# Patient Record
Sex: Male | Born: 1965 | Race: White | Hispanic: No | Marital: Married | Smoking: Never smoker
Health system: Southern US, Community
[De-identification: ages and names within clinical notes are randomized; demographics above are authoritative.]

## PROBLEM LIST (undated history)

## (undated) DIAGNOSIS — I209 Angina pectoris, unspecified: Secondary | ICD-10-CM

## (undated) DIAGNOSIS — M25859 Other specified joint disorders, unspecified hip: Secondary | ICD-10-CM

## (undated) DIAGNOSIS — E785 Hyperlipidemia, unspecified: Secondary | ICD-10-CM

## (undated) DIAGNOSIS — M5412 Radiculopathy, cervical region: Secondary | ICD-10-CM

## (undated) DIAGNOSIS — M1990 Unspecified osteoarthritis, unspecified site: Secondary | ICD-10-CM

## (undated) DIAGNOSIS — J302 Other seasonal allergic rhinitis: Secondary | ICD-10-CM

## (undated) DIAGNOSIS — E119 Type 2 diabetes mellitus without complications: Secondary | ICD-10-CM

## (undated) HISTORY — PX: FINGER SURGERY: SHX640

## (undated) HISTORY — PX: EYE SURGERY: SHX253

---

## 2011-02-23 ENCOUNTER — Ambulatory Visit: Payer: Self-pay | Admitting: Gastroenterology

## 2015-05-22 ENCOUNTER — Encounter: Payer: Self-pay | Admitting: *Deleted

## 2015-05-23 ENCOUNTER — Ambulatory Visit
Admission: RE | Admit: 2015-05-23 | Discharge: 2015-05-23 | Disposition: A | Discharge status: Home / Self Care | Payer: 59 | Source: Ambulatory Visit | Attending: Gastroenterology | Admitting: Gastroenterology

## 2015-05-23 ENCOUNTER — Ambulatory Visit: Payer: 59 | Admitting: Anesthesiology

## 2015-05-23 ENCOUNTER — Encounter
Admission: RE | Disposition: A | Discharge status: Home / Self Care | Payer: Self-pay | Source: Ambulatory Visit | Attending: Gastroenterology

## 2015-05-23 ENCOUNTER — Encounter: Payer: Self-pay | Admitting: Anesthesiology

## 2015-05-23 DIAGNOSIS — D123 Benign neoplasm of transverse colon: Secondary | ICD-10-CM | POA: Diagnosis not present

## 2015-05-23 DIAGNOSIS — Z6837 Body mass index (BMI) 37.0-37.9, adult: Secondary | ICD-10-CM | POA: Diagnosis not present

## 2015-05-23 DIAGNOSIS — D125 Benign neoplasm of sigmoid colon: Secondary | ICD-10-CM | POA: Insufficient documentation

## 2015-05-23 DIAGNOSIS — E785 Hyperlipidemia, unspecified: Secondary | ICD-10-CM | POA: Insufficient documentation

## 2015-05-23 DIAGNOSIS — J309 Allergic rhinitis, unspecified: Secondary | ICD-10-CM | POA: Insufficient documentation

## 2015-05-23 DIAGNOSIS — E669 Obesity, unspecified: Secondary | ICD-10-CM | POA: Diagnosis not present

## 2015-05-23 DIAGNOSIS — Z7951 Long term (current) use of inhaled steroids: Secondary | ICD-10-CM | POA: Diagnosis not present

## 2015-05-23 DIAGNOSIS — Z79899 Other long term (current) drug therapy: Secondary | ICD-10-CM | POA: Diagnosis not present

## 2015-05-23 DIAGNOSIS — Z1211 Encounter for screening for malignant neoplasm of colon: Secondary | ICD-10-CM | POA: Diagnosis not present

## 2015-05-23 DIAGNOSIS — E119 Type 2 diabetes mellitus without complications: Secondary | ICD-10-CM | POA: Diagnosis not present

## 2015-05-23 HISTORY — DX: Other seasonal allergic rhinitis: J30.2

## 2015-05-23 HISTORY — PX: COLONOSCOPY WITH PROPOFOL: SHX5780

## 2015-05-23 HISTORY — DX: Hyperlipidemia, unspecified: E78.5

## 2015-05-23 HISTORY — DX: Type 2 diabetes mellitus without complications: E11.9

## 2015-05-23 LAB — GLUCOSE, CAPILLARY: GLUCOSE-CAPILLARY: 106 mg/dL — AB (ref 65–99)

## 2015-05-23 SURGERY — COLONOSCOPY WITH PROPOFOL
Anesthesia: General

## 2015-05-23 MED ORDER — SODIUM CHLORIDE 0.9 % IV SOLN
INTRAVENOUS | Status: DC
  Administered 2015-05-23: 1000 mL via INTRAVENOUS

## 2015-05-23 MED ORDER — PROPOFOL 500 MG/50ML IV EMUL
INTRAVENOUS | Status: DC | PRN
  Administered 2015-05-23: 120 ug/kg/min via INTRAVENOUS

## 2015-05-23 MED ORDER — SODIUM CHLORIDE 0.9 % IV SOLN: INTRAVENOUS | Status: DC

## 2015-05-23 MED ORDER — PROPOFOL 10 MG/ML IV BOLUS
INTRAVENOUS | Status: DC | PRN
  Administered 2015-05-23 (×3): 50 mg via INTRAVENOUS

## 2015-05-23 NOTE — Anesthesia Postprocedure Evaluation (Signed)
  Anesthesia Post-op Note  Patient: Gary Mendez  Procedure(s) Performed: Procedure(s): COLONOSCOPY WITH PROPOFOL (N/A)  Anesthesia type:General  Patient location: PACU  Post pain: Pain level controlled  Post assessment: Post-op Vital signs reviewed, Patient's Cardiovascular Status Stable, Respiratory Function Stable, Patent Airway and No signs of Nausea or vomiting  Post vital signs: Reviewed and stable  Last Vitals:  Filed Vitals:   05/23/15 1125  BP: 131/98  Pulse: 63  Temp:   Resp: 14    Level of consciousness: awake, alert  and patient cooperative  Complications: No apparent anesthesia complications

## 2015-05-23 NOTE — Op Note (Signed)
[facility] Patient Name: Gary Mendez Procedure Date: 05/23/2015 10:06 AM MRN: [id number] Account #: [id number] Date of Birth: 08/07/1966 Admit Type: Outpatient Age: 49 Room: [facility] ENDO ROOM 3 Gender: Male Note Status: Finalized Procedure:         Colonoscopy Indications:       Screening for colorectal malignant neoplasm Providers:         Lollie Sails, MD Referring MD:      Renee Rival (Referring MD) Medicines:         Monitored Anesthesia Care Complications:     No immediate complications. Procedure:         Pre-Anesthesia Assessment:                    - ASA Grade Assessment: III - A patient with severe                     systemic disease.                    After obtaining informed consent, the colonoscope was                     passed under direct vision. Throughout the procedure, the                     patient's blood pressure, pulse, and oxygen saturations                     were monitored continuously. The Colonoscope was                     introduced through the anus and advanced to the the cecum,                     identified by appendiceal orifice and ileocecal valve. The                     quality of the bowel preparation was good except the                     ascending colon was poor. Findings:      A 4 mm polyp was found at the hepatic flexure. The polyp was flat. The       polyp was removed with a cold snare. Resection and retrieval were       complete.      A 11 mm polyp was found in the distal sigmoid colon. The polyp was       pedunculated. The polyp was removed with a hot snare. Resection and       retrieval were complete.      The digital rectal exam was normal.      The retroflexed view of the distal rectum and anal verge was normal and       showed no anal or rectal abnormalities. Impression:        - One 4 mm polyp at the hepatic flexure. Resected and                     retrieved.    - One 11 mm polyp in the distal sigmoid colon. Resected                     and retrieved.                    -  The distal rectum and anal verge are normal on                     retroflexion view. Recommendation:    - Discharge patient to home.                    - Telephone GI clinic for pathology results in 1 week. Procedure Code(s): --- Professional ---                    [phone], Colonoscopy, flexible; with removal of tumor(s),                     polyp(s), or other lesion(s) by snare technique Diagnosis Code(s): --- Professional ---                    V76.51, Special screening for malignant neoplasms of colon                    211.3, Benign neoplasm of colon CPT copyright 2014 American Medical Association. All rights reserved. The codes documented in this report are preliminary and upon coder review may  be revised to meet current compliance requirements. Lollie Sails, MD 05/23/2015 10:45:01 AM This report has been signed electronically. Number of Addenda: 0 Note Initiated On: 05/23/2015 10:06 AM Scope Withdrawal Time: 0 hours 15 minutes 20 seconds  Total Procedure Duration: 0 hours 24 minutes 7 seconds       [facility]

## 2015-05-23 NOTE — Transfer of Care (Signed)
Immediate Anesthesia Transfer of Care Note  Patient: Gary Mendez  Procedure(s) Performed: Procedure(s): COLONOSCOPY WITH PROPOFOL (N/A)  Patient Location: PACU  Anesthesia Type:General  Level of Consciousness: awake and oriented  Airway & Oxygen Therapy: Patient Spontanous Breathing and Patient connected to nasal cannula oxygen  Post-op Assessment: Report given to RN and Post -op Vital signs reviewed and stable  Post vital signs: stable  Last Vitals:  Filed Vitals:   05/23/15 1040  BP:   Pulse:   Temp: 36.4 C  Resp:     Complications: No apparent anesthesia complications

## 2015-05-23 NOTE — H&P (Signed)
Outpatient short stay form Pre-procedure 05/23/2015 10:04 AM Lollie Sails MD  Primary Physician: Angelina Ok FNP  Reason for visit:  Screening colonoscopy  History of present illness:  Patient is a 49 year old male presenting for his first colonoscopy. He takes no aspirin products or blood thinners. He tolerated his prep for colonoscopy well.    Current facility-administered medications:  .  0.9 %  sodium chloride infusion, , Intravenous, Continuous, Lollie Sails, MD, Last Rate: 20 mL/hr at 05/23/15 0944, 1,000 mL at 05/23/15 0944 .  0.9 %  sodium chloride infusion, , Intravenous, Continuous, Lollie Sails, MD  Prescriptions prior to admission  Medication Sig Dispense Refill Last Dose  . fexofenadine (ALLEGRA) 180 MG tablet Take 180 mg by mouth daily.   Past Week at Unknown time  . fluticasone (FLONASE) 50 MCG/ACT nasal spray Place into both nostrils daily.   Past Week at Unknown time  . metFORMIN (GLUCOPHAGE) 500 MG tablet Take 500 mg by mouth every evening.   Past Week at Unknown time  . pravastatin (PRAVACHOL) 80 MG tablet Take 80 mg by mouth daily.   Past Week at Unknown time  . sildenafil (REVATIO) 20 MG tablet Take 20-40 mg by mouth daily as needed.   Past Week at Unknown time     Allergies  Allergen Reactions  . Propoxyphene Nausea Only    Darvocet-N 100 (Propoxyphene N-Acetaminophen) Nausea and Dizziness      Past Medical History  Diagnosis Date  . Hyperlipidemia   . Diabetes mellitus without complication   . Seasonal allergic rhinitis     Review of systems:      Physical Exam    Heart and lungs: Regular rate and rhythm without rub or gallop, lungs are bilaterally clear    HEENT: Norm cephalic atraumatic eyes are anicteric    Other:     Pertinant exam for procedure: Protuberant nontender bowel sounds are positive normoactive    Planned proceedures: Colonoscopy and indicated procedures I have discussed the risks benefits and  complications of procedures to include not limited to bleeding, infection, perforation and the risk of sedation and the patient wishes to proceed.    Lollie Sails, MD Gastroenterology 05/23/2015  10:04 AM

## 2015-05-23 NOTE — Anesthesia Preprocedure Evaluation (Signed)
Anesthesia Evaluation  Patient identified by MRN, date of birth, ID band Patient awake    Reviewed: Allergy & Precautions, H&P , NPO status , Patient's Chart, lab work & pertinent test results  Airway Mallampati: II  TM Distance: >3 FB Neck ROM: full    Dental no notable dental hx. (+) Teeth Intact   Pulmonary neg shortness of breath,    Pulmonary exam normal breath sounds clear to auscultation       Cardiovascular Exercise Tolerance: Good (-) Past MI Normal cardiovascular exam Rhythm:regular Rate:Normal     Neuro/Psych negative neurological ROS  negative psych ROS   GI/Hepatic Neg liver ROS, GERD  Controlled,  Endo/Other  diabetes, Type 2  Renal/GU negative Renal ROS  negative genitourinary   Musculoskeletal   Abdominal   Peds  Hematology negative hematology ROS (+)   Anesthesia Other Findings Past Medical History:   Hyperlipidemia                                               Diabetes mellitus without complication                       Seasonal allergic rhinitis                                  Obesity BMI 37 Signs and symptoms suggestive of sleep apnea     Reproductive/Obstetrics negative OB ROS                             Anesthesia Physical Anesthesia Plan  ASA: III  Anesthesia Plan: General   Post-op Pain Management:    Induction:   Airway Management Planned:   Additional Equipment:   Intra-op Plan:   Post-operative Plan:   Informed Consent: I have reviewed the patients History and Physical, chart, labs and discussed the procedure including the risks, benefits and alternatives for the proposed anesthesia with the patient or authorized representative who has indicated his/her understanding and acceptance.   Dental Advisory Given  Plan Discussed with: Anesthesiologist, CRNA and Surgeon  Anesthesia Plan Comments:         Anesthesia Quick Evaluation

## 2015-05-26 LAB — SURGICAL PATHOLOGY

## 2015-06-13 ENCOUNTER — Encounter: Payer: Self-pay | Admitting: Gastroenterology

## 2018-05-25 ENCOUNTER — Encounter: Payer: Self-pay | Admitting: *Deleted

## 2018-05-26 ENCOUNTER — Ambulatory Visit: Payer: 59 | Admitting: Anesthesiology

## 2018-05-26 ENCOUNTER — Encounter: Payer: Self-pay | Admitting: Anesthesiology

## 2018-05-26 ENCOUNTER — Ambulatory Visit
Admission: RE | Admit: 2018-05-26 | Discharge: 2018-05-26 | Disposition: A | Discharge status: Home / Self Care | Payer: 59 | Source: Ambulatory Visit | Attending: Gastroenterology | Admitting: Gastroenterology

## 2018-05-26 ENCOUNTER — Encounter
Admission: RE | Disposition: A | Discharge status: Home / Self Care | Payer: Self-pay | Source: Ambulatory Visit | Attending: Gastroenterology

## 2018-05-26 DIAGNOSIS — J302 Other seasonal allergic rhinitis: Secondary | ICD-10-CM | POA: Insufficient documentation

## 2018-05-26 DIAGNOSIS — Z6837 Body mass index (BMI) 37.0-37.9, adult: Secondary | ICD-10-CM | POA: Insufficient documentation

## 2018-05-26 DIAGNOSIS — E119 Type 2 diabetes mellitus without complications: Secondary | ICD-10-CM | POA: Insufficient documentation

## 2018-05-26 DIAGNOSIS — Z7951 Long term (current) use of inhaled steroids: Secondary | ICD-10-CM | POA: Diagnosis not present

## 2018-05-26 DIAGNOSIS — E785 Hyperlipidemia, unspecified: Secondary | ICD-10-CM | POA: Diagnosis not present

## 2018-05-26 DIAGNOSIS — E669 Obesity, unspecified: Secondary | ICD-10-CM | POA: Diagnosis not present

## 2018-05-26 DIAGNOSIS — Z79899 Other long term (current) drug therapy: Secondary | ICD-10-CM | POA: Diagnosis not present

## 2018-05-26 DIAGNOSIS — Z7984 Long term (current) use of oral hypoglycemic drugs: Secondary | ICD-10-CM | POA: Diagnosis not present

## 2018-05-26 DIAGNOSIS — Z86010 Personal history of colonic polyps: Secondary | ICD-10-CM | POA: Insufficient documentation

## 2018-05-26 DIAGNOSIS — D124 Benign neoplasm of descending colon: Secondary | ICD-10-CM | POA: Diagnosis not present

## 2018-05-26 DIAGNOSIS — Z885 Allergy status to narcotic agent status: Secondary | ICD-10-CM | POA: Insufficient documentation

## 2018-05-26 DIAGNOSIS — K5730 Diverticulosis of large intestine without perforation or abscess without bleeding: Secondary | ICD-10-CM | POA: Diagnosis not present

## 2018-05-26 DIAGNOSIS — Z09 Encounter for follow-up examination after completed treatment for conditions other than malignant neoplasm: Secondary | ICD-10-CM | POA: Insufficient documentation

## 2018-05-26 HISTORY — PX: COLONOSCOPY WITH PROPOFOL: SHX5780

## 2018-05-26 LAB — GLUCOSE, CAPILLARY: GLUCOSE-CAPILLARY: 146 mg/dL — AB (ref 70–99)

## 2018-05-26 SURGERY — COLONOSCOPY WITH PROPOFOL
Anesthesia: General

## 2018-05-26 MED ORDER — PROPOFOL 500 MG/50ML IV EMUL
INTRAVENOUS | Status: AC
  Filled 2018-05-26: qty 50

## 2018-05-26 MED ORDER — LIDOCAINE HCL (PF) 2 % IJ SOLN
INTRAMUSCULAR | Status: AC
  Filled 2018-05-26: qty 10

## 2018-05-26 MED ORDER — PROPOFOL 500 MG/50ML IV EMUL
INTRAVENOUS | Status: DC | PRN
  Administered 2018-05-26: 200 ug/kg/min via INTRAVENOUS

## 2018-05-26 MED ORDER — FENTANYL CITRATE (PF) 100 MCG/2ML IJ SOLN
INTRAMUSCULAR | Status: AC
  Filled 2018-05-26: qty 2

## 2018-05-26 MED ORDER — SODIUM CHLORIDE 0.9 % IV SOLN
INTRAVENOUS | Status: DC | PRN
  Administered 2018-05-26: 07:00:00 via INTRAVENOUS

## 2018-05-26 MED ORDER — EPHEDRINE SULFATE 50 MG/ML IJ SOLN
INTRAMUSCULAR | Status: AC
  Filled 2018-05-26: qty 1

## 2018-05-26 MED ORDER — LIDOCAINE HCL (PF) 1 % IJ SOLN
INTRAMUSCULAR | Status: AC
  Filled 2018-05-26: qty 2

## 2018-05-26 MED ORDER — PROPOFOL 10 MG/ML IV BOLUS
INTRAVENOUS | Status: DC | PRN
  Administered 2018-05-26: 50 mg via INTRAVENOUS
  Administered 2018-05-26: 100 mg via INTRAVENOUS

## 2018-05-26 MED ORDER — PROPOFOL 10 MG/ML IV BOLUS
INTRAVENOUS | Status: AC
  Filled 2018-05-26: qty 20

## 2018-05-26 MED ORDER — PHENYLEPHRINE HCL 10 MG/ML IJ SOLN
INTRAMUSCULAR | Status: AC
  Filled 2018-05-26: qty 1

## 2018-05-26 MED ORDER — PHENYLEPHRINE HCL 10 MG/ML IJ SOLN
INTRAMUSCULAR | Status: DC | PRN
  Administered 2018-05-26: 100 ug via INTRAVENOUS

## 2018-05-26 MED ORDER — FENTANYL CITRATE (PF) 100 MCG/2ML IJ SOLN
INTRAMUSCULAR | Status: DC | PRN
  Administered 2018-05-26 (×2): 50 ug via INTRAVENOUS

## 2018-05-26 MED ORDER — LIDOCAINE 2% (20 MG/ML) 5 ML SYRINGE
INTRAMUSCULAR | Status: DC | PRN
  Administered 2018-05-26: 40 mg via INTRAVENOUS

## 2018-05-26 NOTE — Op Note (Signed)
[facility] Gastroenterology Patient Name: Gary Mendez Procedure Date: 05/26/2018 7:24 AM MRN: [id number] Account #: [id number] Date of Birth: [birth year] Admit Type: Outpatient Age: 53 Room: [facility] ENDO ROOM 3 Gender: Male Note Status: Finalized Procedure:            Colonoscopy Indications:          Personal history of colonic polyps Providers:            Lollie Sails, MD Referring MD:         Renee Rival (Referring MD) Medicines:            Monitored Anesthesia Care Complications:        No immediate complications. Procedure:            Pre-Anesthesia Assessment:                       - ASA Grade Assessment: III - A patient with severe                        systemic disease.                       After obtaining informed consent, the colonoscope was                        passed under direct vision. Throughout the procedure,                        the patient's blood pressure, pulse, and oxygen                        saturations were monitored continuously. The                        Colonoscope was introduced through the anus and                        advanced to the the cecum, identified by appendiceal                        orifice and ileocecal valve. The colonoscopy was                        performed without difficulty. The patient tolerated the                        procedure well. The quality of the bowel preparation                        was good. Findings:      Multiple small-mouthed diverticula were found in the sigmoid colon and       descending colon.      Two sessile polyps were found in the descending colon. The polyps were 3       to 5 mm in size. These polyps were removed with a cold snare. Resection       and retrieval were complete.      A 1 mm polyp was found in the rectum. The polyp was sessile. The polyp       was removed with a cold biopsy forceps. Resection and retrieval were  complete.      The retroflexed view  of the distal rectum and anal verge was normal and       showed no anal or rectal abnormalities.      The digital rectal exam was normal. Impression:           - Diverticulosis in the sigmoid colon and in the                        descending colon.                       - Two 3 to 5 mm polyps in the descending colon, removed                        with a cold snare. Resected and retrieved.                       - One 1 mm polyp in the rectum, removed with a cold                        biopsy forceps. Resected and retrieved.                       - The distal rectum and anal verge are normal on                        retroflexion view. Recommendation:       - Discharge patient to home.                       - Telephone GI clinic for pathology results in 1 week. Procedure Code(s):    --- Professional ---                       [phone], Colonoscopy, flexible; with removal of tumor(s),                        polyp(s), or other lesion(s) by snare technique                       45380, 2, Colonoscopy, flexible; with biopsy, single                        or multiple Diagnosis Code(s):    --- Professional ---                       D12.4, Benign neoplasm of descending colon                       K62.1, Rectal polyp                       Z86.010, Personal history of colonic polyps                       K57.30, Diverticulosis of large intestine without                        perforation or abscess without bleeding CPT copyright 2017 American Medical Association. All rights reserved. The codes documented in this report are preliminary and upon  coder review may  be revised to meet current compliance requirements. Lollie Sails, MD 05/26/2018 8:09:24 AM This report has been signed electronically. Number of Addenda: 0 Note Initiated On: 05/26/2018 7:24 AM Scope Withdrawal Time: 0 hours 13 minutes 16 seconds  Total Procedure Duration: 0 hours 18 minutes 37 seconds       [facility]

## 2018-05-26 NOTE — Transfer of Care (Signed)
Immediate Anesthesia Transfer of Care Note  Patient: Gary Mendez  Procedure(s) Performed: COLONOSCOPY WITH PROPOFOL (N/A )  Patient Location: PACU and Endoscopy Unit  Anesthesia Type:General  Level of Consciousness: awake and drowsy  Airway & Oxygen Therapy: Patient Spontanous Breathing and Patient connected to nasal cannula oxygen  Post-op Assessment: Report given to RN and Post -op Vital signs reviewed and stable  Post vital signs: Reviewed and stable  Last Vitals:  Vitals Value Taken Time  BP    Temp    Pulse    Resp    SpO2      Last Pain:  Vitals:   05/26/18 0711  TempSrc: Tympanic  PainSc: 0-No pain         Complications: No apparent anesthesia complications

## 2018-05-26 NOTE — Anesthesia Procedure Notes (Signed)
Performed by: Mumtaz Lovins, CRNA       

## 2018-05-26 NOTE — Anesthesia Post-op Follow-up Note (Signed)
Anesthesia QCDR form completed.        

## 2018-05-26 NOTE — H&P (Signed)
Outpatient short stay form Pre-procedure 05/26/2018 7:16 AM Lollie Sails MD  Primary Physician: Angelina Ok, NP  Reason for visit: Colonoscopy  History of present illness: Patient is a 52 year old male presenting today as above.  He has a personal history of adenomatous colon polyps with his last colonoscopy 05/23/2015.  Tolerated his prep well.  He takes no aspirin or blood thinning agent.   No current facility-administered medications for this encounter.   Facility-Administered Medications Ordered in Other Encounters:  .  0.9 %  sodium chloride infusion, , , Continuous PRN, Courtney Paris, CRNA  Medications Prior to Admission  Medication Sig Dispense Refill Last Dose  . Multiple Vitamin (MULTIVITAMIN) tablet Take 1 tablet by mouth daily.     . fexofenadine (ALLEGRA) 180 MG tablet Take 180 mg by mouth daily.   Past Week at Unknown time  . fluticasone (FLONASE) 50 MCG/ACT nasal spray Place into both nostrils daily.   Past Week at Unknown time  . metFORMIN (GLUCOPHAGE) 500 MG tablet Take 500 mg by mouth every evening.   Past Week at Unknown time  . pravastatin (PRAVACHOL) 80 MG tablet Take 80 mg by mouth daily.   Past Week at Unknown time  . sildenafil (REVATIO) 20 MG tablet Take 20-40 mg by mouth daily as needed.   Past Week at Unknown time     Allergies  Allergen Reactions  . Propoxyphene Nausea Only    Darvocet-N 100 (Propoxyphene N-Acetaminophen) Nausea and Dizziness      Past Medical History:  Diagnosis Date  . Diabetes mellitus without complication ([place])   . Hyperlipidemia   . Seasonal allergic rhinitis     Review of systems:      Physical Exam    Heart and lungs: Regular rate and rhythm without rub or gallop, lungs are bilaterally clear.    HEENT: Normocephalic atraumatic eyes are anicteric    Other:    Pertinant exam for procedure: Soft nontender nondistended bowel sounds positive normoactive.    Planned proceedures: Colonoscopy and indicated  procedures. I have discussed the risks benefits and complications of procedures to include not limited to bleeding, infection, perforation and the risk of sedation and the patient wishes to proceed.    Lollie Sails, MD Gastroenterology 05/26/2018  7:16 AM

## 2018-05-26 NOTE — Anesthesia Preprocedure Evaluation (Signed)
Anesthesia Evaluation  Patient identified by MRN, date of birth, ID band Patient awake    Reviewed: Allergy & Precautions, H&P , NPO status , Patient's Chart, lab work & pertinent test results  Airway Mallampati: II  TM Distance: >3 FB Neck ROM: full    Dental no notable dental hx. (+) Teeth Intact   Pulmonary neg shortness of breath,           Cardiovascular Exercise Tolerance: Good (-) Past MI      Neuro/Psych negative neurological ROS  negative psych ROS   GI/Hepatic Neg liver ROS, GERD  Controlled,  Endo/Other  diabetes, Type 2  Renal/GU negative Renal ROS  negative genitourinary   Musculoskeletal   Abdominal   Peds  Hematology negative hematology ROS (+)   Anesthesia Other Findings Past Medical History:   Hyperlipidemia                                               Diabetes mellitus without complication                       Seasonal allergic rhinitis                                  Obesity BMI 37 Signs and symptoms suggestive of sleep apnea     Reproductive/Obstetrics negative OB ROS                             Anesthesia Physical  Anesthesia Plan  ASA: III  Anesthesia Plan: General   Post-op Pain Management:    Induction: Intravenous  PONV Risk Score and Plan: 2 and Propofol infusion and TIVA  Airway Management Planned: Natural Airway and Nasal Cannula  Additional Equipment:   Intra-op Plan:   Post-operative Plan:   Informed Consent: I have reviewed the patients History and Physical, chart, labs and discussed the procedure including the risks, benefits and alternatives for the proposed anesthesia with the patient or authorized representative who has indicated his/her understanding and acceptance.   Dental Advisory Given  Plan Discussed with: Anesthesiologist, CRNA and Surgeon  Anesthesia Plan Comments:         Anesthesia Quick Evaluation

## 2018-05-27 ENCOUNTER — Encounter: Payer: Self-pay | Admitting: Gastroenterology

## 2018-05-28 NOTE — Anesthesia Postprocedure Evaluation (Signed)
Anesthesia Post Note  Patient: Gary Mendez  Procedure(s) Performed: COLONOSCOPY WITH PROPOFOL (N/A )  Patient location during evaluation: Endoscopy Anesthesia Type: General Level of consciousness: awake and alert Pain management: pain level controlled Vital Signs Assessment: post-procedure vital signs reviewed and stable Respiratory status: spontaneous breathing, nonlabored ventilation, respiratory function stable and patient connected to nasal cannula oxygen Cardiovascular status: blood pressure returned to baseline and stable Postop Assessment: no apparent nausea or vomiting Anesthetic complications: no     Last Vitals:  Vitals:   05/26/18 0829 05/26/18 0839  BP: 115/86 115/81  Pulse: 72 69  Resp: 11 12  Temp:    SpO2: 99% 99%    Last Pain:  Vitals:   05/27/18 0948  TempSrc:   PainSc: 0-No pain                 Martha Clan

## 2018-05-30 LAB — SURGICAL PATHOLOGY

## 2018-11-14 ENCOUNTER — Other Ambulatory Visit: Payer: Self-pay | Admitting: Nurse Practitioner

## 2018-11-14 DIAGNOSIS — M5412 Radiculopathy, cervical region: Secondary | ICD-10-CM

## 2018-12-15 ENCOUNTER — Ambulatory Visit: Payer: 59

## 2019-01-08 ENCOUNTER — Ambulatory Visit: Payer: 59

## 2019-02-08 ENCOUNTER — Other Ambulatory Visit: Payer: Self-pay

## 2019-02-08 ENCOUNTER — Ambulatory Visit
Admission: RE | Admit: 2019-02-08 | Discharge: 2019-02-08 | Disposition: A | Discharge status: Home / Self Care | Payer: 59 | Source: Ambulatory Visit | Attending: Nurse Practitioner | Admitting: Nurse Practitioner

## 2019-02-08 DIAGNOSIS — M5412 Radiculopathy, cervical region: Secondary | ICD-10-CM | POA: Diagnosis present

## 2019-02-08 IMAGING — MR MRI CERVICAL SPINE WITHOUT CONTRAST
5 series · 38 of 48 positions shown · non-contrast
Comparison: None.

CLINICAL DATA: Neck pain, pain between the shoulders and left arm
pain and tingling for 3 months. No known injury.

EXAM:
MRI CERVICAL SPINE WITHOUT CONTRAST
TECHNIQUE: Multiplanar, multisequence MR imaging of the cervical spine was
performed. No intravenous contrast was administered.

[Series 5: T2 · sagittal · 3.0mm · 0.62mm/px · 6 of 15 slices shown (1 of 2)]
[im 1/15]
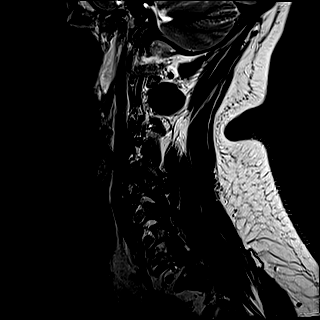
[im 3/15]
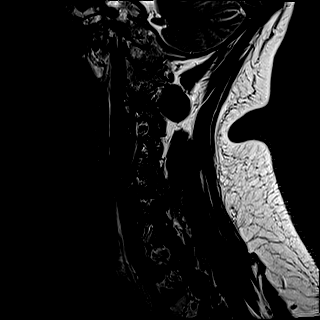
[im 6/15]
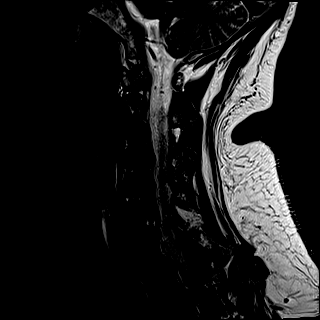
[im 9/15]
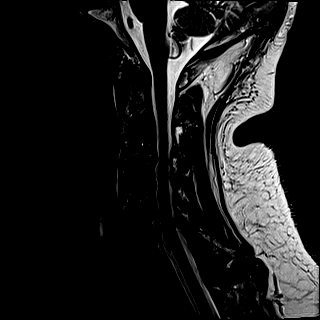
[im 12/15]
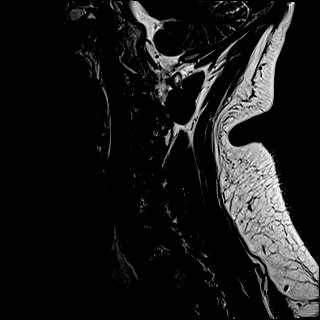
[im 15/15]
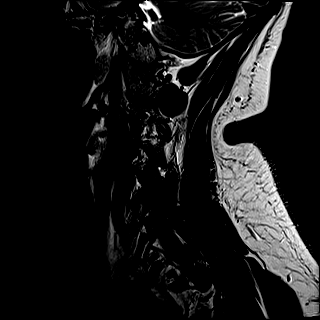

[Series 6: FLAIR · sagittal · 3.0mm · 0.78mm/px · 7 of 15 slices shown]
[im 1/15]
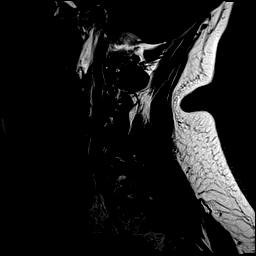
[im 3/15]
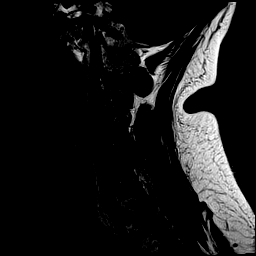
[im 5/15]
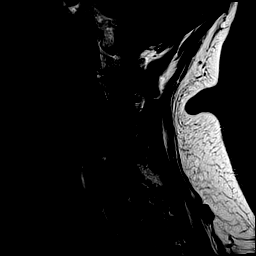
[im 8/15]
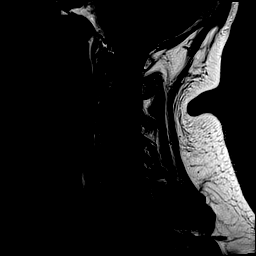
[im 10/15]
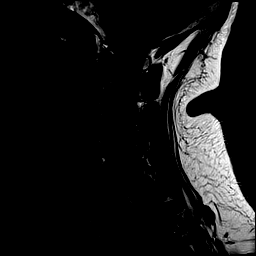
[im 12/15]
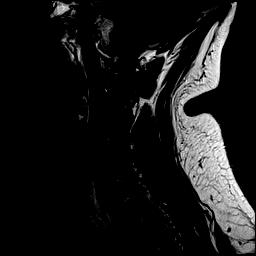
[im 15/15]
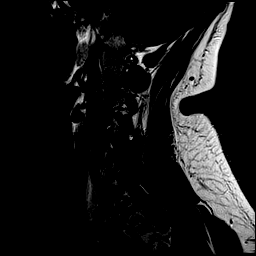

[Series 7: STIR · sagittal · 3.0mm · 0.62mm/px · 7 of 15 slices shown]
[im 1/15]
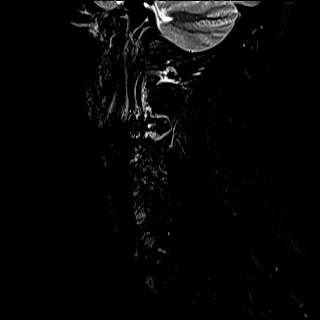
[im 3/15]
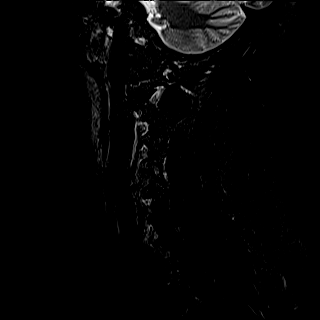
[im 5/15]
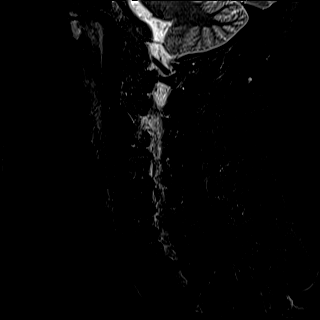
[im 8/15]
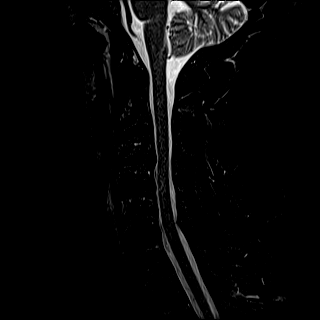
[im 10/15]
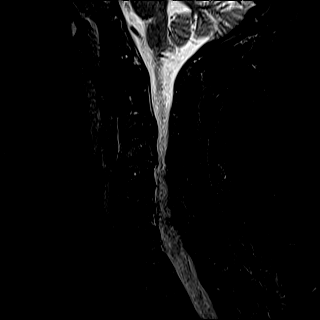
[im 12/15]
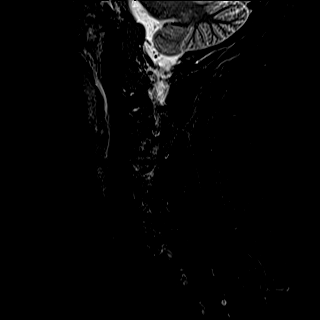
[im 15/15]
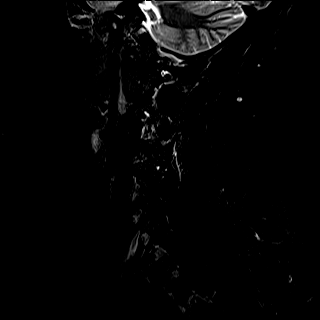

[Series 8: T2 · axial · 3.0mm · 0.70mm/px · z∈[-73,+23]mm · 10 of 29 slices shown (2 of 2)]
[im 1/29]
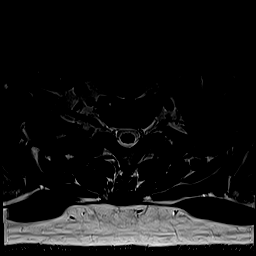
[im 3/29]
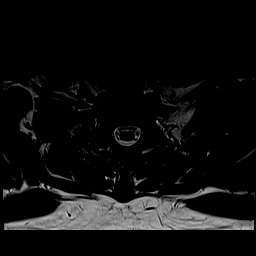
[im 5/29]
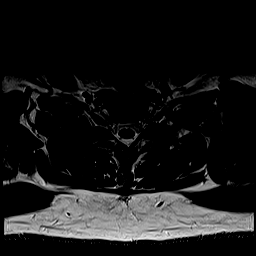
[im 7/29]
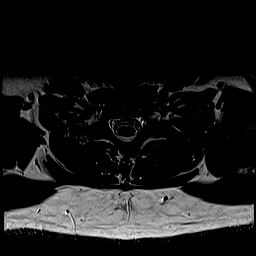
[im 9/29]
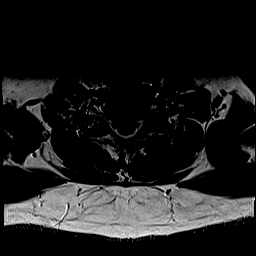
[im 13/29]
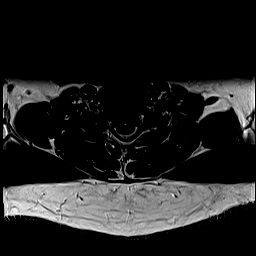
[im 16/29]
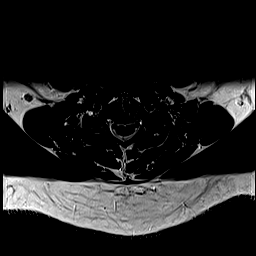
[im 20/29]
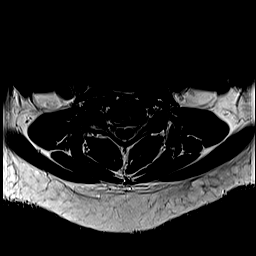
[im 24/29]
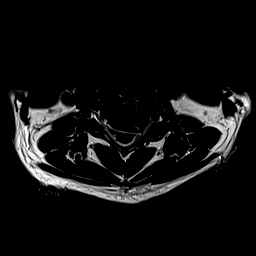
[im 29/29]
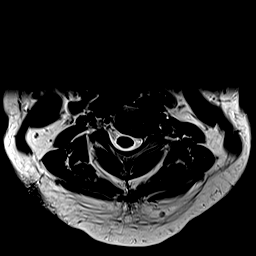

[Series 9: ax mpgr · axial · 3.0mm · 0.35mm/px · z∈[-73,+23]mm · 8 of 29 slices shown]
[im 1/29]
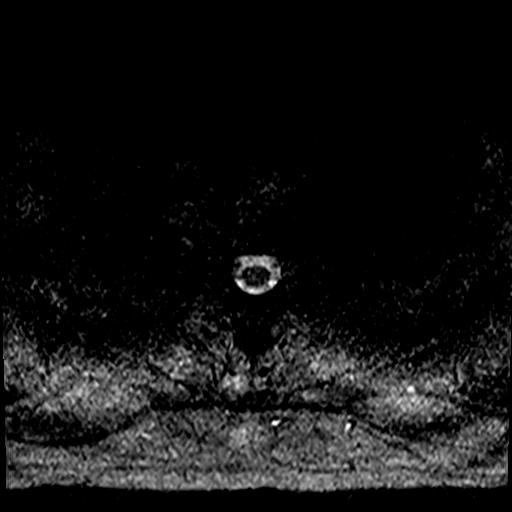
[im 5/29]
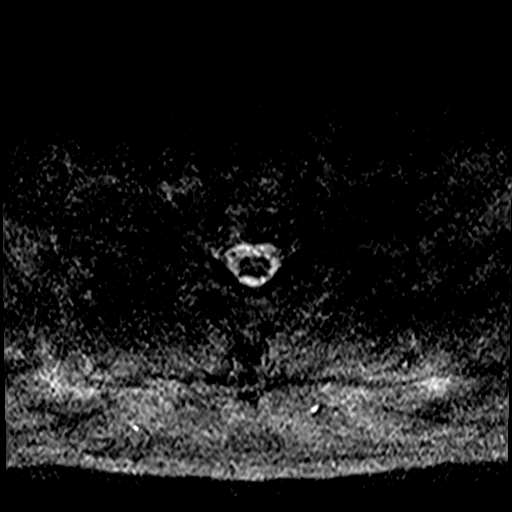
[im 9/29]
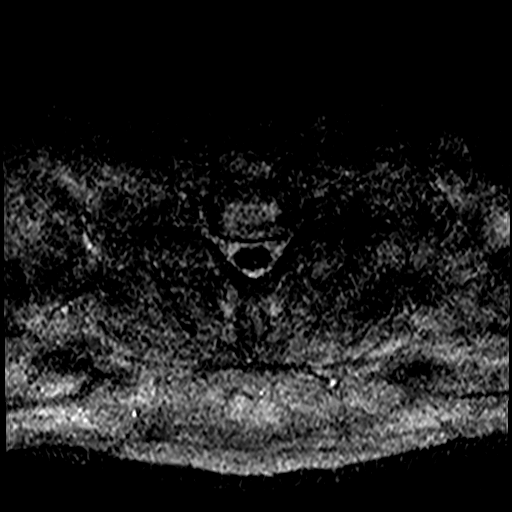
[im 13/29]
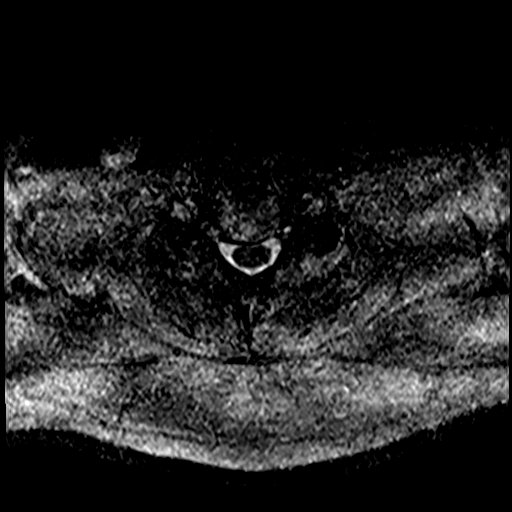
[im 16/29]
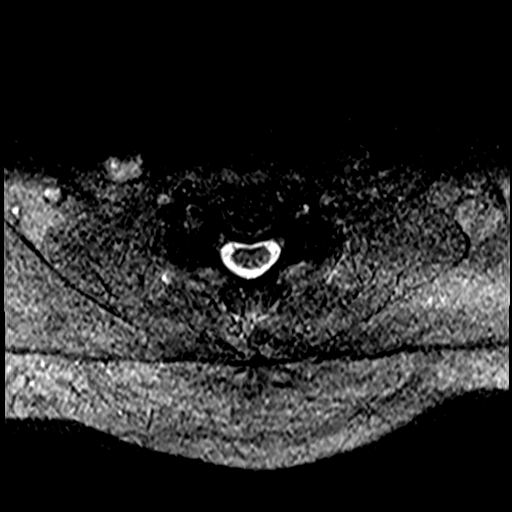
[im 20/29]
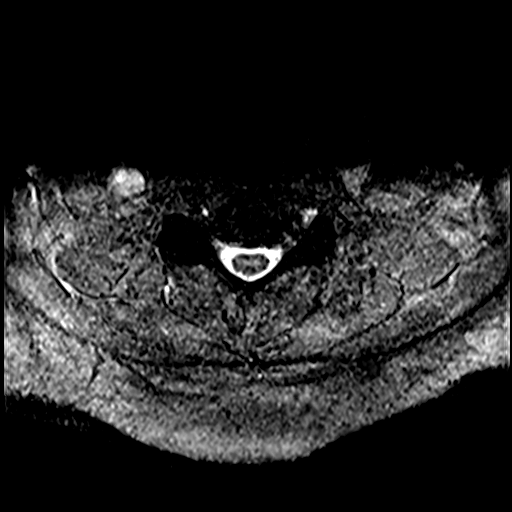
[im 24/29]
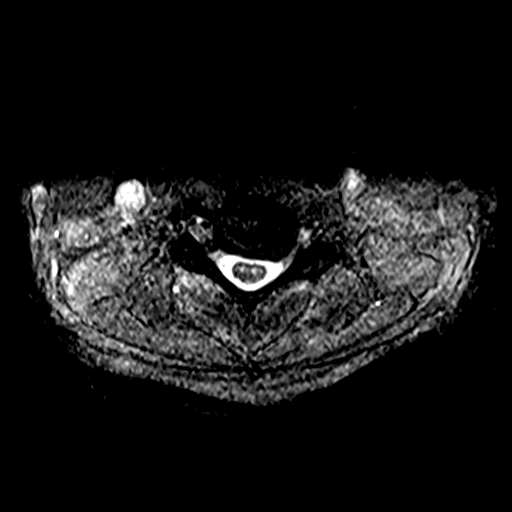
[im 29/29]
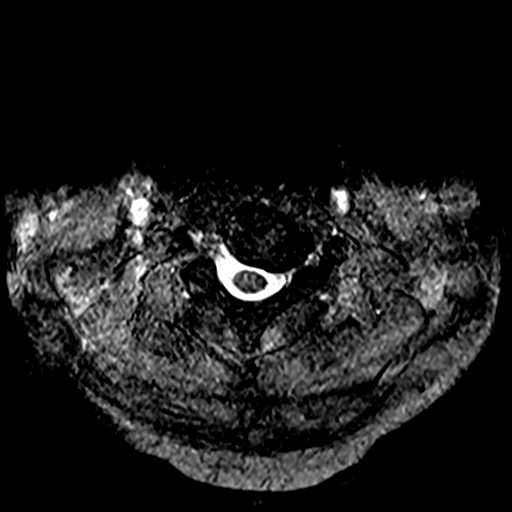

[38 of 48 positions shown; findings below may reference images not displayed]

FINDINGS: Alignment: Maintained with straightening of lordosis noted.

Vertebrae: No fracture or worrisome lesion.

Cord: Normal signal throughout.

Posterior Fossa, vertebral arteries, paraspinal tissues: Negative.

Disc levels:

C2-3: Uncovertebral spurring on the left causes moderately severe
foraminal stenosis. The central canal and right foramen are open.

C3-4: There is a shallow disc bulge and uncovertebral disease, more
notable on the left. Mild central canal narrowing is present. The
right foramen is open. Moderate to moderately severe left foraminal
narrowing is identified.

C4-5: Right much worse than left facet degenerative change is seen.
There is mild marrow edema in the right facets. The patient has a
shallow disc bulge and bilateral uncovertebral disease. The ventral
thecal sac is narrowed but not effaced. Moderate to moderately
severe foraminal narrowing is worse on the right.

C5-6: Shallow disc bulge and uncovertebral disease. The central
canal is open. Mild bilateral foraminal narrowing.

C6-7: There is a shallow disc bulge, uncovertebral disease and some
facet arthropathy, more notable on the right. The central canal is
open. Moderately severe bilateral foraminal narrowing is present.

C7-T1: Mild facet degenerative change.  Otherwise negative.
IMPRESSION: Moderately severe left foraminal narrowing at C2-3 due to
uncovertebral spurring.

Moderate to moderately severe left foraminal narrowing at C3-4 due
to uncovertebral spurring. There is mild central canal stenosis at
this level.

Mild marrow edema in the right facets at C4-5. Disc narrows but does
not efface the ventral thecal sac at this level. Moderate to
moderately severe foraminal narrowing is worse on the right.

Mild bilateral foraminal narrowing C5-6.

Uncovertebral disease causes moderately severe bilateral foraminal
narrowing at C6-7.

## 2019-02-09 ENCOUNTER — Ambulatory Visit: Payer: 59

## 2020-09-23 DIAGNOSIS — Z9889 Other specified postprocedural states: Secondary | ICD-10-CM

## 2020-09-23 HISTORY — DX: Other specified postprocedural states: Z98.890

## 2020-10-06 ENCOUNTER — Other Ambulatory Visit: Payer: Self-pay | Admitting: Nurse Practitioner

## 2020-10-06 DIAGNOSIS — M25512 Pain in left shoulder: Secondary | ICD-10-CM

## 2020-10-07 ENCOUNTER — Other Ambulatory Visit: Payer: Self-pay

## 2020-10-07 ENCOUNTER — Ambulatory Visit (HOSPITAL_COMMUNITY)
Admission: RE | Admit: 2020-10-07 | Discharge: 2020-10-07 | Disposition: A | Discharge status: Home / Self Care | Payer: 59 | Source: Ambulatory Visit | Attending: Nurse Practitioner | Admitting: Nurse Practitioner

## 2020-10-07 DIAGNOSIS — M25512 Pain in left shoulder: Secondary | ICD-10-CM | POA: Diagnosis not present
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
## 2020-10-21 ENCOUNTER — Other Ambulatory Visit: Payer: Self-pay | Admitting: Surgery

## 2020-10-23 ENCOUNTER — Other Ambulatory Visit: Payer: Self-pay

## 2020-10-23 ENCOUNTER — Other Ambulatory Visit
Admission: RE | Admit: 2020-10-23 | Discharge: 2020-10-23 | Disposition: A | Discharge status: Home / Self Care | Payer: 59 | Source: Ambulatory Visit | Attending: Surgery | Admitting: Surgery

## 2020-10-23 HISTORY — DX: Unspecified osteoarthritis, unspecified site: M19.90

## 2020-10-23 HISTORY — DX: Radiculopathy, cervical region: M54.12

## 2020-10-23 HISTORY — DX: Other specified joint disorders, unspecified hip: M25.859

## 2020-10-23 NOTE — Patient Instructions (Addendum)
INSTRUCTIONS FOR SURGERY     Your surgery is scheduled for:   Wednesday, MARCH 9TH    To find out your arrival time for the day of surgery,          please call [phone] between 1 pm and 3 pm on :  Tuesday, MARCH 8TH     When you arrive for surgery, report to [facility].     ONCE THEY ARE DONE WITH YOU, GO TO THE SECOND FLOOR AND SIGN IN AT THE       SURGICAL DESK.        REMEMBER: Instructions that are not followed completely may result in serious medical risk,  up to and including death, or upon the discretion of your surgeon and anesthesiologist,            your surgery may need to be rescheduled.  __X__ 1. Do not eat food after midnight the night before your procedure.                    No gum, candy, lozenger, tic tacs, tums or hard candies.                  ABSOLUTELY NOTHING SOLID IN YOUR MOUTH AFTER MIDNIGHT                    You may drink unlimited clear liquids up to 2 hours before you are scheduled to arrive for surgery.                   Do not drink anything within those 2 hours unless you need to take medicine, then take the                   smallest amount you need.  Clear liquids include:  water, apple juice without pulp,                   any flavor Gatorade, Black coffee, black tea.  Sugar may be added but no dairy/ honey /lemon.                        Broth and jello is not considered a clear liquid.  __x__  2. On the morning of surgery, please brush your teeth with toothpaste and water. You may rinse with                  mouthwash if you wish but DO NOT SWALLOW TOOTHPASTE OR MOUTHWASH  __X___3. NO alcohol for 24 hours before or after surgery.  __x___ 4.  Do NOT smoke or use e-cigarettes for 24 HOURS PRIOR TO SURGERY.                      DO NOT Use any chewable tobacco products for at least 6 hours prior to surgery.  __x___ 5. If you start any new medication after this  appointment and prior to surgery, please                   Bring it with you on  the day of surgery.  ___x__ 6. Notify your doctor if there is any change in your medical condition, such as fever,                  infection, vomitting, diarrhea or any open sores.  __x___ 7.  USE the CHG SOAP as instructed, the night before surgery and the day of surgery.                   Once you have washed with this soap, do NOT use any of the following: Powders, perfumes                    or lotions. Please do not wear make up, hairpins, clips or nail polish. You MAY NOT wear deodorant.                   Men may shave their face and neck.                     DO NOT wear ANY jewelry on the day of surgery. If there are rings that are too tight to                    remove easily, please address this prior to the surgery day. Piercings need to be removed.                                                                     NO METAL ON YOUR BODY.                    Do NOT bring any valuables.  If you came to Pre-Admit testing then you will not need license,                     insurance card or credit card.  If you will be staying overnight, please either leave your things in                     the car or have your family be responsible for these items.                     [facility] IS NOT RESPONSIBLE FOR BELONGINGS OR VALUABLES.  ___X__ 8. DO NOT wear contact lenses on surgery day.  You may not have dentures,                     Hearing aides, contacts or glasses in the operating room. These items can be                    Placed in the Recovery Room to receive immediately after surgery.  __x___ 9. IF YOU ARE SCHEDULED TO GO HOME ON THE SAME DAY, YOU MUST                   Have someone to drive you home and to stay with you  for the first 24 hours.                    Have an arrangement prior to arriving on surgery day.  ___x__ 10. Take the following medications on the morning of surgery with a sip of  water:                              1. ALLEGRA, if you need it                     2. FLONASE, if you need it                     3.   __X__  12. STOP ALL ASPIRIN PRODUCTS AS OF TODAY, MARCH 3RD                       THIS INCLUDES BC POWDERS / GOODIES POWDER  __x___ 13. STOP Anti-inflammatories as of TODAY, MARCH 3RD                      This includes IBUPROFEN / MOTRIN / ADVIL / ALEVE/ NAPROXYN                    YOU MAY TAKE TYLENOL ANY TIME PRIOR TO SURGERY.  __X___ 14.  Stop supplements until after surgery.                     This includes: MULTIVITAMINS / [place] may continue taking Vitamin B12 / Vitamin D3 but do not take on the morning of surgery.  _X_____16.  Stop Metformin 2 full days prior to surgery.  Stop AFTER DOSE ON Sunday, MARCH 6TH.                        TAKE YOU OZEMPIC AS USUAL THIS Sunday.                                     Do NOT take any diabetes medications on surgery day.  __X___17.  Continue to take the following medications but do not take on the morning of surgery:                       METFORMIN   __X____18.  Wear clean and comfortable clothing to the hospital.                     AN OVERSIZED SHIRT OR A BUTTON DOWN WOULD WORK BEST.  CONTINUE TO TAKE YOUR EVENING MEDICATIONS AS USUAL.   BRING YOUR SMILE TO SURGERY !!  :):)

## 2020-10-27 ENCOUNTER — Encounter
Admission: RE | Admit: 2020-10-27 | Discharge: 2020-10-27 | Disposition: A | Discharge status: Home / Self Care | Payer: 59 | Source: Ambulatory Visit | Attending: Surgery | Admitting: Surgery

## 2020-10-27 ENCOUNTER — Other Ambulatory Visit: Payer: Self-pay

## 2020-10-27 DIAGNOSIS — Z20822 Contact with and (suspected) exposure to covid-19: Secondary | ICD-10-CM | POA: Insufficient documentation

## 2020-10-27 DIAGNOSIS — Z01812 Encounter for preprocedural laboratory examination: Secondary | ICD-10-CM | POA: Insufficient documentation

## 2020-10-27 LAB — BASIC METABOLIC PANEL
Anion gap: 9 (ref 5–15)
BUN: 17 mg/dL (ref 6–20)
CO2: 22 mmol/L (ref 22–32)
Calcium: 9 mg/dL (ref 8.9–10.3)
Chloride: 109 mmol/L (ref 98–111)
Creatinine, Ser: 0.82 mg/dL (ref 0.61–1.24)
GFR, Estimated: >60 mL/min (ref >60)
Glucose, Bld: 96 mg/dL (ref 70–99)
Potassium: 4.1 mmol/L (ref 3.5–5.1)
Sodium: 140 mmol/L (ref 135–145)

## 2020-10-27 LAB — CBC
HCT: 46.8 % (ref 39.0–52.0)
Hemoglobin: 15.9 g/dL (ref 13.0–17.0)
MCH: 28.5 pg (ref 26.0–34.0)
MCHC: 34 g/dL (ref 30.0–36.0)
MCV: 84 fL (ref 80.0–100.0)
Platelets: 239 10*3/uL (ref 150–400)
RBC: 5.57 MIL/uL (ref 4.22–5.81)
RDW: 13.1 % (ref 11.5–15.5)
WBC: 8.3 10*3/uL (ref 4.0–10.5)
nRBC: 0 % (ref 0.0–0.2)

## 2020-10-28 LAB — SARS CORONAVIRUS 2 (TAT 6-24 HRS): SARS Coronavirus 2: NEGATIVE

## 2020-10-29 ENCOUNTER — Ambulatory Visit
Admission: RE | Admit: 2020-10-29 | Discharge: 2020-10-29 | Disposition: A | Discharge status: Home / Self Care | Payer: 59 | Attending: Surgery | Admitting: Surgery

## 2020-10-29 ENCOUNTER — Encounter: Payer: Self-pay | Admitting: Surgery

## 2020-10-29 ENCOUNTER — Ambulatory Visit: Payer: 59 | Admitting: Anesthesiology

## 2020-10-29 ENCOUNTER — Ambulatory Visit: Payer: 59

## 2020-10-29 ENCOUNTER — Encounter
Admission: RE | Disposition: A | Discharge status: Home / Self Care | Payer: Self-pay | Source: Home / Self Care | Attending: Surgery

## 2020-10-29 ENCOUNTER — Other Ambulatory Visit: Payer: Self-pay

## 2020-10-29 DIAGNOSIS — Z419 Encounter for procedure for purposes other than remedying health state, unspecified: Secondary | ICD-10-CM

## 2020-10-29 DIAGNOSIS — Z7984 Long term (current) use of oral hypoglycemic drugs: Secondary | ICD-10-CM | POA: Diagnosis not present

## 2020-10-29 DIAGNOSIS — M779 Enthesopathy, unspecified: Secondary | ICD-10-CM | POA: Diagnosis not present

## 2020-10-29 DIAGNOSIS — M7522 Bicipital tendinitis, left shoulder: Secondary | ICD-10-CM | POA: Insufficient documentation

## 2020-10-29 DIAGNOSIS — Z885 Allergy status to narcotic agent status: Secondary | ICD-10-CM | POA: Insufficient documentation

## 2020-10-29 DIAGNOSIS — Z79899 Other long term (current) drug therapy: Secondary | ICD-10-CM | POA: Diagnosis not present

## 2020-10-29 DIAGNOSIS — S46102A Unspecified injury of muscle, fascia and tendon of long head of biceps, left arm, initial encounter: Secondary | ICD-10-CM | POA: Insufficient documentation

## 2020-10-29 DIAGNOSIS — S46012A Strain of muscle(s) and tendon(s) of the rotator cuff of left shoulder, initial encounter: Secondary | ICD-10-CM | POA: Diagnosis not present

## 2020-10-29 DIAGNOSIS — Z91012 Allergy to eggs: Secondary | ICD-10-CM | POA: Insufficient documentation

## 2020-10-29 HISTORY — PX: SHOULDER ARTHROSCOPY WITH SUBACROMIAL DECOMPRESSION, ROTATOR CUFF REPAIR AND BICEP TENDON REPAIR: SHX5687

## 2020-10-29 LAB — GLUCOSE, CAPILLARY
Glucose-Capillary: 113 mg/dL — ABNORMAL HIGH (ref 70–99)
Glucose-Capillary: 164 mg/dL — ABNORMAL HIGH (ref 70–99)
Glucose-Capillary: 159 mg/dL — ABNORMAL HIGH (ref 70–99)

## 2020-10-29 SURGERY — SHOULDER ARTHROSCOPY WITH SUBACROMIAL DECOMPRESSION, ROTATOR CUFF REPAIR AND BICEP TENDON REPAIR
Anesthesia: General | Site: Shoulder | Laterality: Left

## 2020-10-29 MED ORDER — MIDAZOLAM HCL 2 MG/2ML IJ SOLN
INTRAMUSCULAR | Status: AC
  Filled 2020-10-29: qty 2

## 2020-10-29 MED ORDER — PHENYLEPHRINE HCL-NACL 10-0.9 MG/250ML-% IV SOLN
INTRAVENOUS | Status: DC | PRN
  Administered 2020-10-29: 50 ug/min via INTRAVENOUS

## 2020-10-29 MED ORDER — DIPHENHYDRAMINE HCL 50 MG/ML IJ SOLN: 25.0000 mg | Freq: Once | INTRAMUSCULAR | Status: AC

## 2020-10-29 MED ORDER — ROCURONIUM BROMIDE 10 MG/ML (PF) SYRINGE
PREFILLED_SYRINGE | INTRAVENOUS | Status: AC
  Filled 2020-10-29: qty 10

## 2020-10-29 MED ORDER — DIPHENHYDRAMINE HCL 50 MG/ML IJ SOLN
INTRAMUSCULAR | Status: AC
  Administered 2020-10-29: 25 mg via INTRAVENOUS
  Filled 2020-10-29: qty 1

## 2020-10-29 MED ORDER — BUPIVACAINE LIPOSOME 1.3 % IJ SUSP
INTRAMUSCULAR | Status: AC
  Filled 2020-10-29: qty 20

## 2020-10-29 MED ORDER — ONDANSETRON HCL 4 MG/2ML IJ SOLN
INTRAMUSCULAR | Status: AC
  Administered 2020-10-29: 4 mg
  Filled 2020-10-29: qty 2

## 2020-10-29 MED ORDER — BUPIVACAINE-EPINEPHRINE (PF) 0.5% -1:200000 IJ SOLN
INTRAMUSCULAR | Status: AC
  Filled 2020-10-29: qty 90

## 2020-10-29 MED ORDER — OXYCODONE HCL 5 MG PO TABS: 5.0000 mg | ORAL_TABLET | ORAL | 0 refills | Status: DC | PRN

## 2020-10-29 MED ORDER — SODIUM CHLORIDE 0.9 % IV SOLN: INTRAVENOUS | Status: DC

## 2020-10-29 MED ORDER — BUPIVACAINE HCL (PF) 0.5 % IJ SOLN
INTRAMUSCULAR | Status: DC | PRN
  Administered 2020-10-29: 10 mL

## 2020-10-29 MED ORDER — ACETAMINOPHEN 10 MG/ML IV SOLN: 1000.0000 mg | Freq: Once | INTRAVENOUS | Status: DC | PRN

## 2020-10-29 MED ORDER — FAMOTIDINE 20 MG PO TABS
ORAL_TABLET | ORAL | Status: AC
  Administered 2020-10-29: 20 mg via ORAL
  Filled 2020-10-29: qty 1

## 2020-10-29 MED ORDER — MIDAZOLAM HCL 2 MG/2ML IJ SOLN: 2.0000 mg | Freq: Once | INTRAMUSCULAR | Status: DC

## 2020-10-29 MED ORDER — CHLORHEXIDINE GLUCONATE 0.12 % MT SOLN: 15.0000 mL | Freq: Once | OROMUCOSAL | Status: AC

## 2020-10-29 MED ORDER — EPINEPHRINE PF 1 MG/ML IJ SOLN
INTRAMUSCULAR | Status: AC
  Filled 2020-10-29: qty 1

## 2020-10-29 MED ORDER — FENTANYL CITRATE (PF) 100 MCG/2ML IJ SOLN
INTRAMUSCULAR | Status: AC
  Filled 2020-10-29: qty 2

## 2020-10-29 MED ORDER — METOCLOPRAMIDE HCL 10 MG PO TABS: 5.0000 mg | ORAL_TABLET | Freq: Three times a day (TID) | ORAL | Status: DC | PRN

## 2020-10-29 MED ORDER — METOCLOPRAMIDE HCL 5 MG/ML IJ SOLN: 5.0000 mg | Freq: Three times a day (TID) | INTRAMUSCULAR | Status: DC | PRN

## 2020-10-29 MED ORDER — OXYCODONE HCL 5 MG/5ML PO SOLN: 5.0000 mg | Freq: Once | ORAL | Status: DC | PRN

## 2020-10-29 MED ORDER — SUGAMMADEX SODIUM 200 MG/2ML IV SOLN
INTRAVENOUS | Status: DC | PRN
  Administered 2020-10-29: 200 mg via INTRAVENOUS

## 2020-10-29 MED ORDER — DROPERIDOL 2.5 MG/ML IJ SOLN
0.6250 mg | Freq: Once | INTRAMUSCULAR | Status: AC | PRN
  Administered 2020-10-29: 0.625 mg via INTRAVENOUS
  Filled 2020-10-29: qty 0.25

## 2020-10-29 MED ORDER — KETOROLAC TROMETHAMINE 30 MG/ML IJ SOLN
30.0000 mg | Freq: Once | INTRAMUSCULAR | Status: AC
  Administered 2020-10-29: 30 mg via INTRAVENOUS

## 2020-10-29 MED ORDER — OXYCODONE HCL 5 MG PO TABS: 5.0000 mg | ORAL_TABLET | Freq: Once | ORAL | Status: DC | PRN

## 2020-10-29 MED ORDER — BUPIVACAINE LIPOSOME 1.3 % IJ SUSP
INTRAMUSCULAR | Status: DC | PRN
  Administered 2020-10-29: 10 mL

## 2020-10-29 MED ORDER — DEXTROSE 5 % IV SOLN
3.0000 g | INTRAVENOUS | Status: AC
  Administered 2020-10-29: 3 g via INTRAVENOUS
  Filled 2020-10-29: qty 3000

## 2020-10-29 MED ORDER — PHENYLEPHRINE HCL (PRESSORS) 10 MG/ML IV SOLN
INTRAVENOUS | Status: AC
  Filled 2020-10-29: qty 1

## 2020-10-29 MED ORDER — DIPHENHYDRAMINE HCL 25 MG PO CAPS: 25.0000 mg | ORAL_CAPSULE | Freq: Once | ORAL | Status: DC

## 2020-10-29 MED ORDER — FENTANYL CITRATE (PF) 100 MCG/2ML IJ SOLN
INTRAMUSCULAR | Status: AC
  Administered 2020-10-29: 50 ug via INTRAVENOUS
  Filled 2020-10-29: qty 2

## 2020-10-29 MED ORDER — LIDOCAINE HCL (CARDIAC) PF 100 MG/5ML IV SOSY
PREFILLED_SYRINGE | INTRAVENOUS | Status: DC | PRN
  Administered 2020-10-29: 100 mg via INTRAVENOUS

## 2020-10-29 MED ORDER — ONDANSETRON HCL 4 MG/2ML IJ SOLN: 4.0000 mg | Freq: Four times a day (QID) | INTRAMUSCULAR | Status: DC | PRN

## 2020-10-29 MED ORDER — BUPIVACAINE HCL (PF) 0.5 % IJ SOLN
INTRAMUSCULAR | Status: AC
  Filled 2020-10-29: qty 10

## 2020-10-29 MED ORDER — ORAL CARE MOUTH RINSE: 15.0000 mL | Freq: Once | OROMUCOSAL | Status: AC

## 2020-10-29 MED ORDER — DEXAMETHASONE SODIUM PHOSPHATE 10 MG/ML IJ SOLN
INTRAMUSCULAR | Status: AC
  Filled 2020-10-29: qty 1

## 2020-10-29 MED ORDER — LACTATED RINGERS IV SOLN
INTRAVENOUS | Status: DC | PRN
  Administered 2020-10-29: 3000 mL

## 2020-10-29 MED ORDER — LIDOCAINE HCL (PF) 2 % IJ SOLN
INTRAMUSCULAR | Status: AC
  Filled 2020-10-29: qty 5

## 2020-10-29 MED ORDER — ACETAMINOPHEN 10 MG/ML IV SOLN
INTRAVENOUS | Status: AC
  Filled 2020-10-29: qty 100

## 2020-10-29 MED ORDER — CHLORHEXIDINE GLUCONATE 0.12 % MT SOLN
OROMUCOSAL | Status: AC
  Administered 2020-10-29: 15 mL via OROMUCOSAL
  Filled 2020-10-29: qty 15

## 2020-10-29 MED ORDER — MIDAZOLAM HCL 2 MG/2ML IJ SOLN
INTRAMUSCULAR | Status: AC
  Administered 2020-10-29: 2 mg
  Filled 2020-10-29: qty 2

## 2020-10-29 MED ORDER — PHENYLEPHRINE HCL (PRESSORS) 10 MG/ML IV SOLN
INTRAVENOUS | Status: DC | PRN
  Administered 2020-10-29: 200 ug via INTRAVENOUS
  Administered 2020-10-29: 150 ug via INTRAVENOUS

## 2020-10-29 MED ORDER — PROPOFOL 10 MG/ML IV BOLUS
INTRAVENOUS | Status: AC
  Filled 2020-10-29: qty 80

## 2020-10-29 MED ORDER — FENTANYL CITRATE (PF) 100 MCG/2ML IJ SOLN: 50.0000 ug | Freq: Once | INTRAMUSCULAR | Status: AC

## 2020-10-29 MED ORDER — ROCURONIUM BROMIDE 100 MG/10ML IV SOLN
INTRAVENOUS | Status: DC | PRN
  Administered 2020-10-29: 100 mg via INTRAVENOUS

## 2020-10-29 MED ORDER — KETOROLAC TROMETHAMINE 30 MG/ML IJ SOLN
INTRAMUSCULAR | Status: AC
  Filled 2020-10-29: qty 1

## 2020-10-29 MED ORDER — ONDANSETRON HCL 4 MG PO TABS: 4.0000 mg | ORAL_TABLET | Freq: Four times a day (QID) | ORAL | Status: DC | PRN

## 2020-10-29 MED ORDER — FAMOTIDINE 20 MG PO TABS: 20.0000 mg | ORAL_TABLET | Freq: Once | ORAL | Status: AC

## 2020-10-29 MED ORDER — FENTANYL CITRATE (PF) 100 MCG/2ML IJ SOLN
INTRAMUSCULAR | Status: DC | PRN
  Administered 2020-10-29 (×2): 50 ug via INTRAVENOUS

## 2020-10-29 MED ORDER — ONDANSETRON HCL 4 MG/2ML IJ SOLN
INTRAMUSCULAR | Status: DC | PRN
  Administered 2020-10-29: 4 mg via INTRAVENOUS

## 2020-10-29 MED ORDER — OXYCODONE HCL 5 MG PO TABS: 5.0000 mg | ORAL_TABLET | ORAL | Status: DC | PRN

## 2020-10-29 MED ORDER — DEXAMETHASONE SODIUM PHOSPHATE 10 MG/ML IJ SOLN
INTRAMUSCULAR | Status: DC | PRN
  Administered 2020-10-29: 10 mg via INTRAVENOUS

## 2020-10-29 MED ORDER — PROPOFOL 10 MG/ML IV BOLUS
INTRAVENOUS | Status: DC | PRN
  Administered 2020-10-29: 200 mg via INTRAVENOUS
  Administered 2020-10-29: 50 mg via INTRAVENOUS
  Administered 2020-10-29: 80 mg via INTRAVENOUS

## 2020-10-29 MED ORDER — ONDANSETRON HCL 4 MG/2ML IJ SOLN: 4.0000 mg | Freq: Once | INTRAMUSCULAR | Status: DC | PRN

## 2020-10-29 MED ORDER — BUPIVACAINE-EPINEPHRINE 0.5% -1:200000 IJ SOLN
INTRAMUSCULAR | Status: DC | PRN
  Administered 2020-10-29: 30 mL

## 2020-10-29 MED ORDER — ACETAMINOPHEN 10 MG/ML IV SOLN
INTRAVENOUS | Status: DC | PRN
  Administered 2020-10-29: 1000 mg via INTRAVENOUS

## 2020-10-29 MED ORDER — ONDANSETRON HCL 4 MG/2ML IJ SOLN
INTRAMUSCULAR | Status: AC
  Filled 2020-10-29: qty 2

## 2020-10-29 MED ORDER — FENTANYL CITRATE (PF) 100 MCG/2ML IJ SOLN: 25.0000 ug | INTRAMUSCULAR | Status: DC | PRN

## 2020-10-29 SURGICAL SUPPLY — 56 items
ANCH SUT 2 2.9 2 LD TPR NDL (Anchor) ×1 IMPLANT
ANCH SUT 5.5 KNTLS (Anchor) ×2 IMPLANT
ANCH SUT Q-FX 2.8 (Anchor) ×3 IMPLANT
ANCHOR ALL-SUT Q-FIX 2.8 (Anchor) ×6 IMPLANT
ANCHOR HEALICOIL REGEN 5.5 (Anchor) ×4 IMPLANT
ANCHOR JUGGERKNOT WTAP NDL 2.9 (Anchor) ×2 IMPLANT
APL PRP STRL LF DISP 70% ISPRP (MISCELLANEOUS) ×2
BIT DRILL JUGRKNT W/NDL BIT2.9 (DRILL) ×1 IMPLANT
BLADE FULL RADIUS 3.5 (BLADE) ×2 IMPLANT
BLADE SURG 15 STRL LF DISP TIS (BLADE) ×1 IMPLANT
BLADE SURG 15 STRL SS (BLADE) ×2
BUR ACROMIONIZER 4.0 (BURR) ×2 IMPLANT
CANNULA SHAVER 8MMX76MM (CANNULA) ×2 IMPLANT
CHLORAPREP W/TINT 26 (MISCELLANEOUS) ×4 IMPLANT
COVER MAYO STAND REUSABLE (DRAPES) ×2 IMPLANT
COVER WAND RF STERILE (DRAPES) ×2 IMPLANT
DILATOR 5.5 THREADED HEALICOIL (MISCELLANEOUS) ×2 IMPLANT
DRAPE IMP U-DRAPE 54X76 (DRAPES) ×4 IMPLANT
DRILL JUGGERKNOT W/NDL BIT 2.9 (DRILL) ×2
ELECT CAUTERY BLADE 6.4 (BLADE) ×2 IMPLANT
ELECT REM PT RETURN 9FT ADLT (ELECTROSURGICAL) ×2
ELECTRODE REM PT RTRN 9FT ADLT (ELECTROSURGICAL) ×1 IMPLANT
GAUZE SPONGE 4X4 12PLY STRL (GAUZE/BANDAGES/DRESSINGS) ×2 IMPLANT
GAUZE XEROFORM 1X8 LF (GAUZE/BANDAGES/DRESSINGS) ×2 IMPLANT
GLOVE INDICATOR 8.0 STRL GRN (GLOVE) ×2 IMPLANT
GLOVE SRG 8 PF TXTR STRL LF DI (GLOVE) ×1 IMPLANT
GLOVE SURG ENC MOIS LTX SZ7.5 (GLOVE) ×4 IMPLANT
GLOVE SURG ENC MOIS LTX SZ8 (GLOVE) ×4 IMPLANT
GLOVE SURG UNDER POLY LF SZ8 (GLOVE) ×2
GOWN STRL REUS W/ TWL LRG LVL3 (GOWN DISPOSABLE) ×1 IMPLANT
GOWN STRL REUS W/ TWL XL LVL3 (GOWN DISPOSABLE) ×1 IMPLANT
GOWN STRL REUS W/TWL LRG LVL3 (GOWN DISPOSABLE) ×2
GOWN STRL REUS W/TWL XL LVL3 (GOWN DISPOSABLE) ×2
GRASPER SUT 15 45D LOW PRO (SUTURE) IMPLANT
IV LACTATED RINGER IRRG 3000ML (IV SOLUTION) ×4
IV LACTATED RINGERS 1000ML (IV SOLUTION) ×2 IMPLANT
IV LR IRRIG 3000ML ARTHROMATIC (IV SOLUTION) ×2 IMPLANT
KIT CANNULA 8X76-LX IN CANNULA (CANNULA) IMPLANT
KIT SUTURE 2.8 Q-FIX DISP (MISCELLANEOUS) ×2 IMPLANT
MANIFOLD NEPTUNE II (INSTRUMENTS) ×4 IMPLANT
MASK FACE SPIDER DISP (MASK) ×2 IMPLANT
MAT ABSORB  FLUID 56X50 GRAY (MISCELLANEOUS) ×1
MAT ABSORB FLUID 56X50 GRAY (MISCELLANEOUS) ×1 IMPLANT
PACK ARTHROSCOPY SHOULDER (MISCELLANEOUS) ×2 IMPLANT
PASSER SUT FIRSTPASS SELF (INSTRUMENTS) ×2 IMPLANT
SLING ARM LRG DEEP (SOFTGOODS) ×2 IMPLANT
SLING ULTRA II LG (MISCELLANEOUS) ×2 IMPLANT
STAPLER SKIN PROX 35W (STAPLE) ×2 IMPLANT
STRAP SAFETY 5IN WIDE (MISCELLANEOUS) ×2 IMPLANT
SUT ETHIBOND 0 MO6 C/R (SUTURE) ×2 IMPLANT
SUT ULTRABRAID 2 COBRAID 38 (SUTURE) ×4 IMPLANT
SUT VIC AB 2-0 CT1 27 (SUTURE) ×4
SUT VIC AB 2-0 CT1 TAPERPNT 27 (SUTURE) ×2 IMPLANT
TAPE MICROFOAM 4IN (TAPE) ×2 IMPLANT
TUBING ARTHRO INFLOW-ONLY STRL (TUBING) ×2 IMPLANT
WAND WEREWOLF FLOW 90D (MISCELLANEOUS) ×2 IMPLANT
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
## 2020-10-29 NOTE — Transfer of Care (Signed)
Immediate Anesthesia Transfer of Care Note  Patient: Gary Mendez  Procedure(s) Performed: SHOULDER ARTHROSCOPY DEBRIDEMENT, DECOMPRESSION, REPAIR OF LARGE ROTATOR CUFF TEAR, AND BICEPS TENODESIS - (Left Shoulder)  Patient Location: PACU  Anesthesia Type:GA combined with regional for post-op pain  Level of Consciousness: awake, drowsy and patient cooperative  Airway & Oxygen Therapy: Patient Spontanous Breathing and Patient connected to face mask oxygen  Post-op Assessment: Report given to RN and Post -op Vital signs reviewed and stable  Post vital signs: Reviewed and stable  Last Vitals:  Vitals Value Taken Time  BP 111/79 10/29/20 1000  Temp    Pulse 74 10/29/20 1003  Resp 16 10/29/20 1003  SpO2 98 % 10/29/20 1003  Vitals shown include unvalidated device data.  Last Pain:  Vitals:   10/29/20 0614  TempSrc: Temporal  PainSc: 4          Complications: No complications documented.

## 2020-10-29 NOTE — Anesthesia Procedure Notes (Signed)
Anesthesia Regional Block: Interscalene brachial plexus block   Pre-Anesthetic Checklist: ,, timeout performed, Correct Patient, Correct Site, Correct Laterality, Correct Procedure, Correct Position, site marked, Risks and benefits discussed,  Surgical consent,  Pre-op evaluation,  At surgeon's request and post-op pain management  Laterality: Left  Prep: chloraprep       Needles:  Injection technique: Single-shot  Needle Type: Echogenic Needle     Needle Length: 4cm  Needle Gauge: 25     Additional Needles:   Narrative:  Start time: 10/29/2020 7:13 AM End time: 10/29/2020 7:15 AM Injection made incrementally with aspirations every 5 mL.  Performed by: Personally  Anesthesiologist: Arita Miss, MD  Additional Notes: Patient's chart reviewed and they were deemed appropriate candidate for procedure. Patient educated about risks, benefits, and alternatives of the block including but not limited to: temporary or permanent nerve damage, bleeding, infection, damage to surround tissues, pneumothorax, hemidiaphragmatic paralysis, unilateral Horner's syndrome, block failure, local anesthetic toxicity. Patient expressed understanding. A formal time-out was conducted consistent with institution rules.  Monitors were applied, and minimal sedation used (see nursing record). The site was prepped with skin prep and allowed to dry, and sterile gloves were used. A high frequency linear ultrasound probe with probe cover was utilized. C5-7 nerve roots located, local anesthetic injected around them, and echogenic block needle trajectory was monitored throughout. Aspiration performed every 77ml. Lung and blood vessels were avoided. All injections were performed without resistance and free of blood and paresthesias. The patient tolerated the procedure well.  Injectate: 40ml exparel + 65ml 0.5% bupivacaine.

## 2020-10-29 NOTE — Anesthesia Procedure Notes (Signed)
Procedure Name: Intubation Date/Time: 10/29/2020 7:40 AM Performed by: Lowry Bowl, CRNA Pre-anesthesia Checklist: Patient identified, Emergency Drugs available, Suction available and Patient being monitored Patient Re-evaluated:Patient Re-evaluated prior to induction Oxygen Delivery Method: Circle system utilized Preoxygenation: Pre-oxygenation with 100% oxygen Induction Type: IV induction and Cricoid Pressure applied Ventilation: Mask ventilation without difficulty Laryngoscope Size: McGraph and 4 Grade View: Grade I Tube type: Oral Tube size: 7.0 mm Number of attempts: 1 Airway Equipment and Method: Stylet and Video-laryngoscopy Placement Confirmation: ETT inserted through vocal cords under direct vision,  positive ETCO2 and breath sounds checked- equal and bilateral Secured at: 22 cm Tube secured with: Tape Dental Injury: Teeth and Oropharynx as per pre-operative assessment

## 2020-10-29 NOTE — Anesthesia Postprocedure Evaluation (Signed)
Anesthesia Post Note  Patient: Gary Mendez  Procedure(s) Performed: SHOULDER ARTHROSCOPY DEBRIDEMENT, DECOMPRESSION, REPAIR OF LARGE ROTATOR CUFF TEAR, AND BICEPS TENODESIS - (Left Shoulder)  Patient location during evaluation: PACU Anesthesia Type: General Level of consciousness: awake and alert Pain management: pain level controlled Vital Signs Assessment: post-procedure vital signs reviewed and stable Respiratory status: spontaneous breathing, nonlabored ventilation, respiratory function stable and patient connected to nasal cannula oxygen Cardiovascular status: blood pressure returned to baseline and stable Postop Assessment: no apparent nausea or vomiting Anesthetic complications: yes   Encounter Complications  Complication Outcome Phase Comment  Nausea  Postprocedure, before discharge PONV     Last Vitals:  Vitals:   10/29/20 1046 10/29/20 1140  BP: (!) 94/56 117/65  Pulse: 64 71  Resp: 14 18  Temp: (!) 34.8 C   SpO2: 90% 96%    Last Pain:  Vitals:   10/29/20 1140  TempSrc:   PainSc: 0-No pain                 Arita Miss

## 2020-10-29 NOTE — Discharge Instructions (Addendum)
Orthopedic discharge instructions: Keep dressing dry and intact.  May sponge bathe after dressing changed on post-op day #4 (Sunday).  Cover staples with Band-Aids after drying off. Apply ice frequently to shoulder. Take Aleve 2 tabs BID OR ibuprofen 600-800 mg TID with meals for 7-10 days, then as necessary. Take oxycodone as prescribed when needed.  May supplement with ES Tylenol if necessary. Keep shoulder immobilizer on at all times except may remove for bathing purposes. Follow-up in 10-14 days or as scheduled.   AMBULATORY SURGERY  DISCHARGE INSTRUCTIONS   1) The drugs that you were given will stay in your system until tomorrow so for the next 24 hours you should not:  A) Drive an automobile B) Make any legal decisions C) Drink any alcoholic beverage   2) You may resume regular meals tomorrow.  Today it is better to start with liquids and gradually work up to solid foods.  You may eat anything you prefer, but it is better to start with liquids, then soup and crackers, and gradually work up to solid foods.   3) Please notify your doctor immediately if you have any unusual bleeding, trouble breathing, redness and pain at the surgery site, drainage, fever, or pain not relieved by medication.    4) Additional Instructions:        Please contact your physician with any problems or Same Day Surgery at [phone], Monday through Friday 6 am to 4 pm, or [place] [facility] number at [phone].

## 2020-10-29 NOTE — Anesthesia Preprocedure Evaluation (Signed)
Anesthesia Evaluation  Patient identified by MRN, date of birth, ID band Patient awake    Reviewed: Allergy & Precautions, H&P , NPO status , Patient's Chart, lab work & pertinent test results  History of Anesthesia Complications Negative for: history of anesthetic complications  Airway Mallampati: II  TM Distance: >3 FB Neck ROM: full    Dental no notable dental hx. (+) Teeth Intact   Pulmonary neg shortness of breath, neg sleep apnea, neg COPD, Patient abstained from smoking.Not current smoker,    Pulmonary exam normal breath sounds clear to auscultation       Cardiovascular Exercise Tolerance: Good METS(-) hypertension(-) CAD and (-) Past MI negative cardio ROS  (-) dysrhythmias  Rhythm:Regular Rate:Normal - Systolic murmurs    Neuro/Psych negative neurological ROS  negative psych ROS   GI/Hepatic Neg liver ROS, GERD  Controlled,  Endo/Other  diabetes, Type 2  Renal/GU negative Renal ROS  negative genitourinary   Musculoskeletal  (+) Arthritis , Osteoarthritis,    Abdominal (+) + obese,   Peds  Hematology negative hematology ROS (+)   Anesthesia Other Findings Past Medical History:   Hyperlipidemia                                               Diabetes mellitus without complication                       Seasonal allergic rhinitis                                  Obesity BMI 37 Signs and symptoms suggestive of sleep apnea     Reproductive/Obstetrics negative OB ROS                             Anesthesia Physical  Anesthesia Plan  ASA: II  Anesthesia Plan: General   Post-op Pain Management:  Regional for Post-op pain   Induction: Intravenous  PONV Risk Score and Plan: 2 and Dexamethasone, Ondansetron and Midazolam  Airway Management Planned: Oral ETT  Additional Equipment: None  Intra-op Plan:   Post-operative Plan: Extubation in OR  Informed Consent: I have  reviewed the patients History and Physical, chart, labs and discussed the procedure including the risks, benefits and alternatives for the proposed anesthesia with the patient or authorized representative who has indicated his/her understanding and acceptance.     Dental Advisory Given  Plan Discussed with: Anesthesiologist, CRNA and Surgeon  Anesthesia Plan Comments: (Discussed risks of anesthesia with patient, including PONV, sore throat, lip/dental damage. Rare risks discussed as well, such as cardiorespiratory and neurological sequelae. Patient understands.  Discussed r/b/a of interscalene block, including elective nature. Risks discussed: - Rare: bleeding, infection, nerve damage - shortness of breath from hemidiaphragmatic paralysis - unilateral horner's syndrome - poor/non-working blocks Patient understands and agrees. )        Anesthesia Quick Evaluation

## 2020-10-30 ENCOUNTER — Encounter: Payer: Self-pay | Admitting: Surgery

## 2021-12-10 ENCOUNTER — Other Ambulatory Visit: Payer: Self-pay | Admitting: Orthopedic Surgery

## 2021-12-31 ENCOUNTER — Other Ambulatory Visit: Payer: Self-pay

## 2021-12-31 ENCOUNTER — Encounter
Admission: RE | Admit: 2021-12-31 | Discharge: 2021-12-31 | Disposition: A | Discharge status: Home / Self Care | Payer: 59 | Source: Ambulatory Visit | Attending: Orthopedic Surgery | Admitting: Orthopedic Surgery

## 2021-12-31 VITALS — BP 149/79 | Resp 16 | Ht 70.0 in | Wt 244.3 lb

## 2021-12-31 DIAGNOSIS — Z01810 Encounter for preprocedural cardiovascular examination: Secondary | ICD-10-CM | POA: Diagnosis not present

## 2021-12-31 DIAGNOSIS — Z01818 Encounter for other preprocedural examination: Secondary | ICD-10-CM | POA: Insufficient documentation

## 2021-12-31 DIAGNOSIS — Z01812 Encounter for preprocedural laboratory examination: Secondary | ICD-10-CM

## 2021-12-31 HISTORY — DX: Angina pectoris, unspecified: I20.9

## 2021-12-31 LAB — TYPE AND SCREEN
ABO/RH(D): A POS
Antibody Screen: NEGATIVE

## 2021-12-31 LAB — COMPREHENSIVE METABOLIC PANEL
ALT: 31 U/L (ref 0–44)
AST: 23 U/L (ref 15–41)
Albumin: 3.6 g/dL (ref 3.5–5.0)
Alkaline Phosphatase: 54 U/L (ref 38–126)
Anion gap: 7 (ref 5–15)
BUN: 16 mg/dL (ref 6–20)
CO2: 24 mmol/L (ref 22–32)
Calcium: 9 mg/dL (ref 8.9–10.3)
Chloride: 108 mmol/L (ref 98–111)
Creatinine, Ser: 0.81 mg/dL (ref 0.61–1.24)
GFR, Estimated: >60 mL/min (ref >60)
Glucose, Bld: 99 mg/dL (ref 70–99)
Potassium: 4.1 mmol/L (ref 3.5–5.1)
Sodium: 139 mmol/L (ref 135–145)
Total Bilirubin: 0.6 mg/dL (ref 0.3–1.2)
Total Protein: 6.5 g/dL (ref 6.5–8.1)

## 2021-12-31 LAB — URINALYSIS, ROUTINE W REFLEX MICROSCOPIC
Bilirubin Urine: NEGATIVE
Glucose, UA: NEGATIVE mg/dL
Hgb urine dipstick: NEGATIVE
Ketones, ur: NEGATIVE mg/dL
Leukocytes,Ua: NEGATIVE
Nitrite: NEGATIVE
Protein, ur: NEGATIVE mg/dL
Specific Gravity, Urine: 1.016 (ref 1.005–1.030)
pH: 5 (ref 5.0–8.0)

## 2021-12-31 LAB — CBC WITH DIFFERENTIAL/PLATELET
Abs Immature Granulocytes: 0.02 10*3/uL (ref 0.00–0.07)
Basophils Absolute: 0.1 10*3/uL (ref 0.0–0.1)
Basophils Relative: 1 %
Eosinophils Absolute: 0.2 10*3/uL (ref 0.0–0.5)
Eosinophils Relative: 3 %
HCT: 47 % (ref 39.0–52.0)
Hemoglobin: 15.4 g/dL (ref 13.0–17.0)
Immature Granulocytes: 0 %
Lymphocytes Relative: 33 %
Lymphs Abs: 2.5 10*3/uL (ref 0.7–4.0)
MCH: 27.8 pg (ref 26.0–34.0)
MCHC: 32.8 g/dL (ref 30.0–36.0)
MCV: 85 fL (ref 80.0–100.0)
Monocytes Absolute: 0.6 10*3/uL (ref 0.1–1.0)
Monocytes Relative: 7 %
Neutro Abs: 4.2 10*3/uL (ref 1.7–7.7)
Neutrophils Relative %: 56 %
Platelets: 229 10*3/uL (ref 150–400)
RBC: 5.53 MIL/uL (ref 4.22–5.81)
RDW: 12.6 % (ref 11.5–15.5)
WBC: 7.6 10*3/uL (ref 4.0–10.5)
nRBC: 0 % (ref 0.0–0.2)

## 2021-12-31 LAB — SURGICAL PCR SCREEN
MRSA, PCR: NEGATIVE
Staphylococcus aureus: POSITIVE — AB

## 2021-12-31 NOTE — Patient Instructions (Addendum)
Your procedure is scheduled on: 01/12/22 - Tuesday ?Report to the Registration Desk on the 1st floor of the [facility]. ?To find out your arrival time, please call [phone] between 1PM - 3PM on: 01/11/22 -  ?If your arrival time is 6:00 am, do not arrive prior to that time as the [facility] entrance doors do not open until 6:00 am. ? ?REMEMBER: ?Instructions that are not followed completely may result in serious medical risk, up to and including death; or upon the discretion of your surgeon and anesthesiologist your surgery may need to be rescheduled. ? ?Do not eat food after midnight the night before surgery.  ?No gum chewing, lozengers or hard candies. ? ?You may however, drink CLEAR liquids up to 2 hours before you are scheduled to arrive for your surgery. Do not drink anything within 2 hours of your scheduled arrival time. ? ?Clear liquids include: ?- water  ?- apple juice without pulp ?- gatorade (not RED colors) ?- black coffee or tea (Do NOT add milk or creamers to the coffee or tea) ?Do NOT drink anything that is not on this list. ? ?Type 1 and Type 2 diabetics should only drink water. ? ?In addition, your doctor has ordered for you to drink the provided  ?Ensure Pre-Surgery Clear Carbohydrate Drink ?Drinking this carbohydrate drink up to two hours before surgery helps to reduce insulin resistance and improve patient outcomes. Please complete drinking 2 hours prior to scheduled arrival time. ? ?TAKE THESE MEDICATIONS THE MORNING OF SURGERY WITH A SIP OF WATER: NONE ? ?One week prior to surgery: meloxicam (MOBIC) 7.5 MG tablet ?Stop Anti-inflammatories (NSAIDS) such as Advil, Aleve, Ibuprofen, Motrin, Naproxen, Naprosyn and Aspirin based products such as Excedrin, Goodys Powder, BC Powder. ? ?Stop a week before , ANY OVER THE COUNTER supplements until after surgery. Cholecalciferol (VITAMIN D3 PO), GARLIC PO, Multiple Vitamin (MULTIVITAMIN WITH MINERALS) TABS tablet, Omega-3 Fatty Acids (FISH  OIL) ? ?You may take Tylenol if needed for pain up until the day of surgery. ? ?No Alcohol for 24 hours before or after surgery. ? ?No Smoking including e-cigarettes for 24 hours prior to surgery.  ?No chewable tobacco products for at least 6 hours prior to surgery.  ?No nicotine patches on the day of surgery. ? ?Do not use any "recreational" drugs for at least a week prior to your surgery.  ?Please be advised that the combination of cocaine and anesthesia may have negative outcomes, up to and including death. ?If you test positive for cocaine, your surgery will be cancelled. ? ?On the morning of surgery brush your teeth with toothpaste and water, you may rinse your mouth with mouthwash if you wish. ?Do not swallow any toothpaste or mouthwash. ? ?Use CHG Soap or wipes as directed on instruction sheet. ? ?Do not wear jewelry, make-up, hairpins, clips or nail polish. ? ?Do not wear lotions, powders, or perfumes.  ? ?Do not shave body from the neck down 48 hours prior to surgery just in case you cut yourself which could leave a site for infection.  ?Also, freshly shaved skin may become irritated if using the CHG soap. ? ?Contact lenses, hearing aids and dentures may not be worn into surgery. ? ?Do not bring valuables to the hospital. [facility] is not responsible for any missing/lost belongings or valuables.  ? ?Wear comfortable clothing (specific to your surgery type) to the hospital. ? ?After surgery, you can help prevent lung complications by doing breathing exercises.  ?Take deep  breaths and cough every 1-2 hours. Your doctor may order a device called an Incentive Spirometer to help you take deep breaths. ?When coughing or sneezing, hold a pillow firmly against your incision with both hands. This is called ?splinting.? Doing this helps protect your incision. It also decreases belly discomfort. ? ?If you are being admitted to the hospital overnight, leave your suitcase in the car. ?After surgery it may be brought  to your room. ? ?If you are being discharged the day of surgery, you will not be allowed to drive home. ?You will need a responsible adult (18 years or older) to drive you home and stay with you that night.  ? ?If you are taking public transportation, you will need to have a responsible adult (18 years or older) with you. ?Please confirm with your physician that it is acceptable to use public transportation.  ? ?Please call the [facility] at [phone] if you have any questions about these instructions. ? ?Surgery Visitation Policy: ? ?Patients undergoing a surgery or procedure may have two family members or support persons with them as long as the person is not COVID-19 positive or experiencing its symptoms.  ? ?Inpatient Visitation:   ? ?Visiting hours are 7 a.m. to 8 p.m. ?Up to four visitors are allowed at one time in a patient room, including children. The visitors may rotate out with other people during the day. One designated support person (adult) may remain overnight.  ?

## 2022-01-11 MED ORDER — CEFAZOLIN SODIUM-DEXTROSE 2-4 GM/100ML-% IV SOLN
2.0000 g | INTRAVENOUS | Status: AC
  Administered 2022-01-12: 2 g via INTRAVENOUS

## 2022-01-11 MED ORDER — LACTATED RINGERS IV SOLN: INTRAVENOUS | Status: DC

## 2022-01-11 MED ORDER — ORAL CARE MOUTH RINSE: 15.0000 mL | Freq: Once | OROMUCOSAL | Status: AC

## 2022-01-11 MED ORDER — CHLORHEXIDINE GLUCONATE 0.12 % MT SOLN
15.0000 mL | Freq: Once | OROMUCOSAL | Status: AC
  Administered 2022-01-12: 15 mL via OROMUCOSAL

## 2022-01-11 MED ORDER — FAMOTIDINE 20 MG PO TABS
20.0000 mg | ORAL_TABLET | Freq: Once | ORAL | Status: AC
  Administered 2022-01-12: 20 mg via ORAL

## 2022-01-12 ENCOUNTER — Ambulatory Visit: Payer: 59

## 2022-01-12 ENCOUNTER — Other Ambulatory Visit: Payer: Self-pay

## 2022-01-12 ENCOUNTER — Ambulatory Visit: Payer: 59 | Admitting: Registered Nurse

## 2022-01-12 ENCOUNTER — Encounter
Admission: RE | Disposition: A | Discharge status: Home Health | Payer: Self-pay | Source: Home / Self Care | Attending: Orthopedic Surgery

## 2022-01-12 ENCOUNTER — Observation Stay
Admission: RE | Admit: 2022-01-12 | Discharge: 2022-01-13 | Disposition: A | Discharge status: Home Health | Payer: 59 | Attending: Orthopedic Surgery | Admitting: Orthopedic Surgery

## 2022-01-12 ENCOUNTER — Encounter: Payer: Self-pay | Admitting: Orthopedic Surgery

## 2022-01-12 ENCOUNTER — Observation Stay: Payer: 59

## 2022-01-12 ENCOUNTER — Ambulatory Visit: Payer: 59 | Admitting: Urgent Care

## 2022-01-12 DIAGNOSIS — Z7982 Long term (current) use of aspirin: Secondary | ICD-10-CM | POA: Insufficient documentation

## 2022-01-12 DIAGNOSIS — Z01812 Encounter for preprocedural laboratory examination: Secondary | ICD-10-CM

## 2022-01-12 DIAGNOSIS — M1611 Unilateral primary osteoarthritis, right hip: Principal | ICD-10-CM | POA: Insufficient documentation

## 2022-01-12 DIAGNOSIS — Z96641 Presence of right artificial hip joint: Secondary | ICD-10-CM

## 2022-01-12 DIAGNOSIS — Z79899 Other long term (current) drug therapy: Secondary | ICD-10-CM | POA: Insufficient documentation

## 2022-01-12 DIAGNOSIS — E119 Type 2 diabetes mellitus without complications: Secondary | ICD-10-CM | POA: Diagnosis not present

## 2022-01-12 HISTORY — PX: TOTAL HIP ARTHROPLASTY: SHX124

## 2022-01-12 LAB — GLUCOSE, CAPILLARY
Glucose-Capillary: 101 mg/dL — ABNORMAL HIGH (ref 70–99)
Glucose-Capillary: 92 mg/dL (ref 70–99)

## 2022-01-12 LAB — CBC
HCT: 47.4 % (ref 39.0–52.0)
Hemoglobin: 15.4 g/dL (ref 13.0–17.0)
MCH: 28.2 pg (ref 26.0–34.0)
MCHC: 32.5 g/dL (ref 30.0–36.0)
MCV: 86.8 fL (ref 80.0–100.0)
Platelets: 224 10*3/uL (ref 150–400)
RBC: 5.46 MIL/uL (ref 4.22–5.81)
RDW: 12.8 % (ref 11.5–15.5)
WBC: 10.9 10*3/uL — ABNORMAL HIGH (ref 4.0–10.5)
nRBC: 0 % (ref 0.0–0.2)

## 2022-01-12 LAB — CREATININE, SERUM
Creatinine, Ser: 0.73 mg/dL (ref 0.61–1.24)
GFR, Estimated: >60 mL/min (ref >60)

## 2022-01-12 LAB — ABO/RH: ABO/RH(D): A POS

## 2022-01-12 IMAGING — DX DG HIP (WITH OR WITHOUT PELVIS) 2-3V*R*
2 series · 2 of 2 positions shown · non-contrast
Comparison: Intraoperative right hip radiographs from earlier today

CLINICAL DATA: Status post right total hip arthroplasty

EXAM:
DG HIP (WITH OR WITHOUT PELVIS) 2-3V RIGHT

[hip ap]
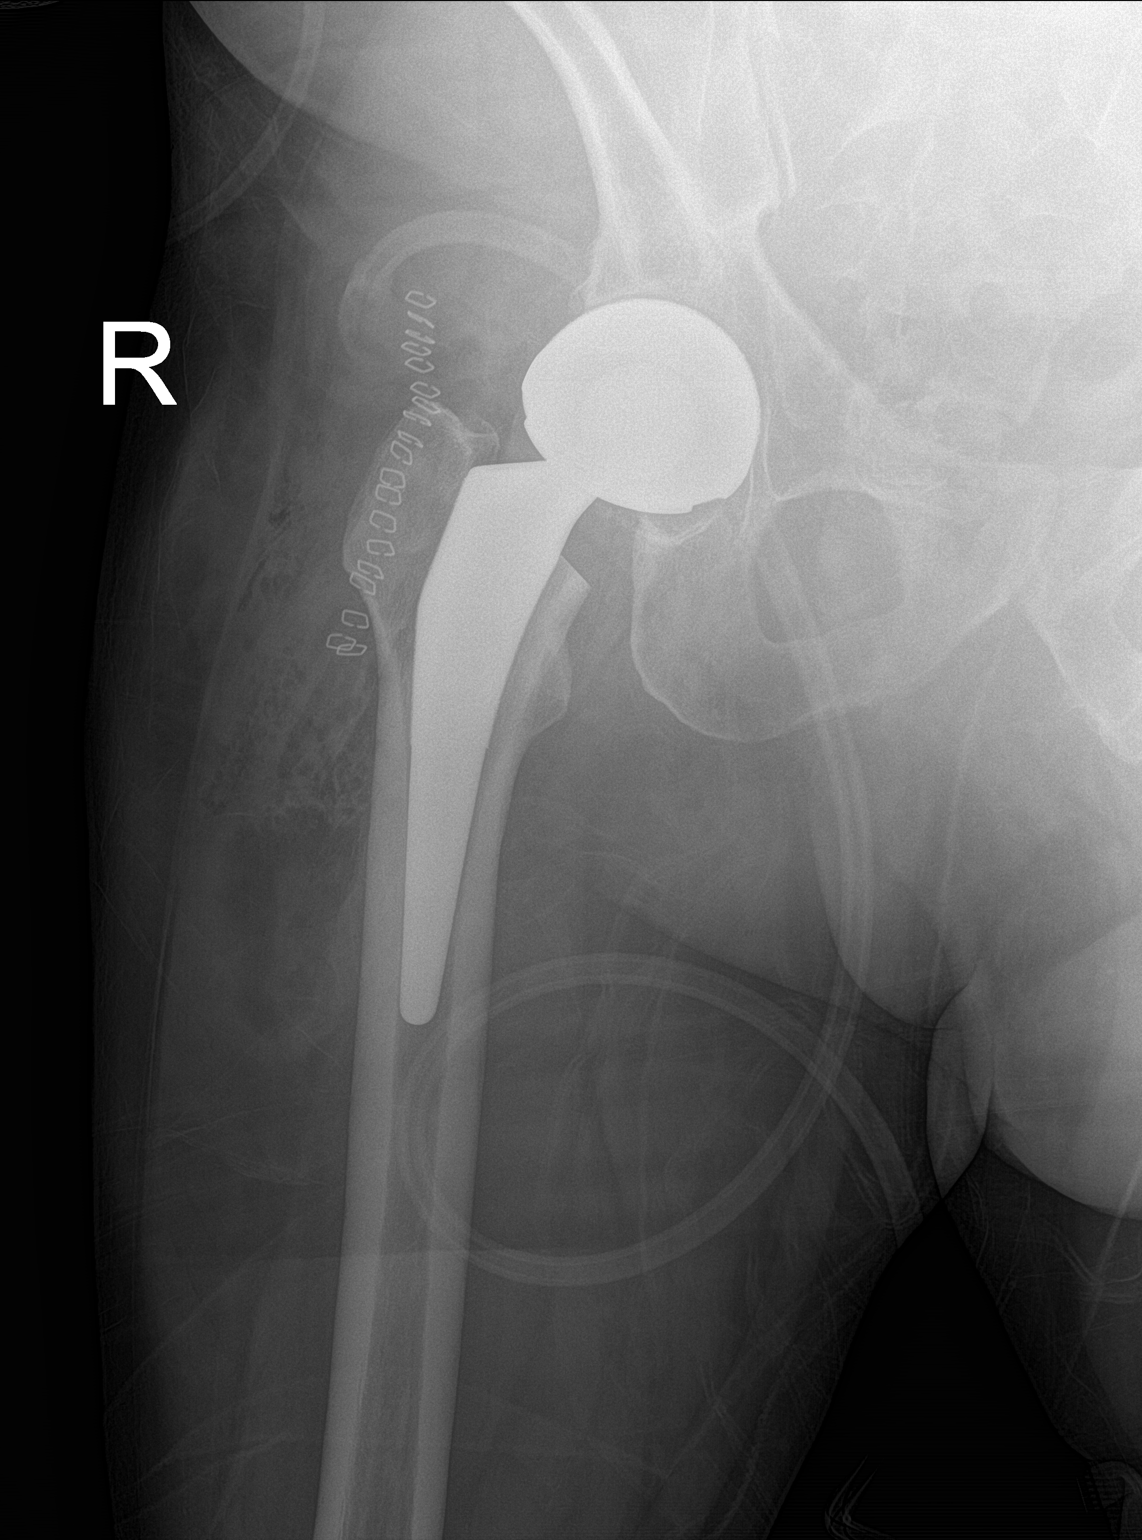

[hip lat]
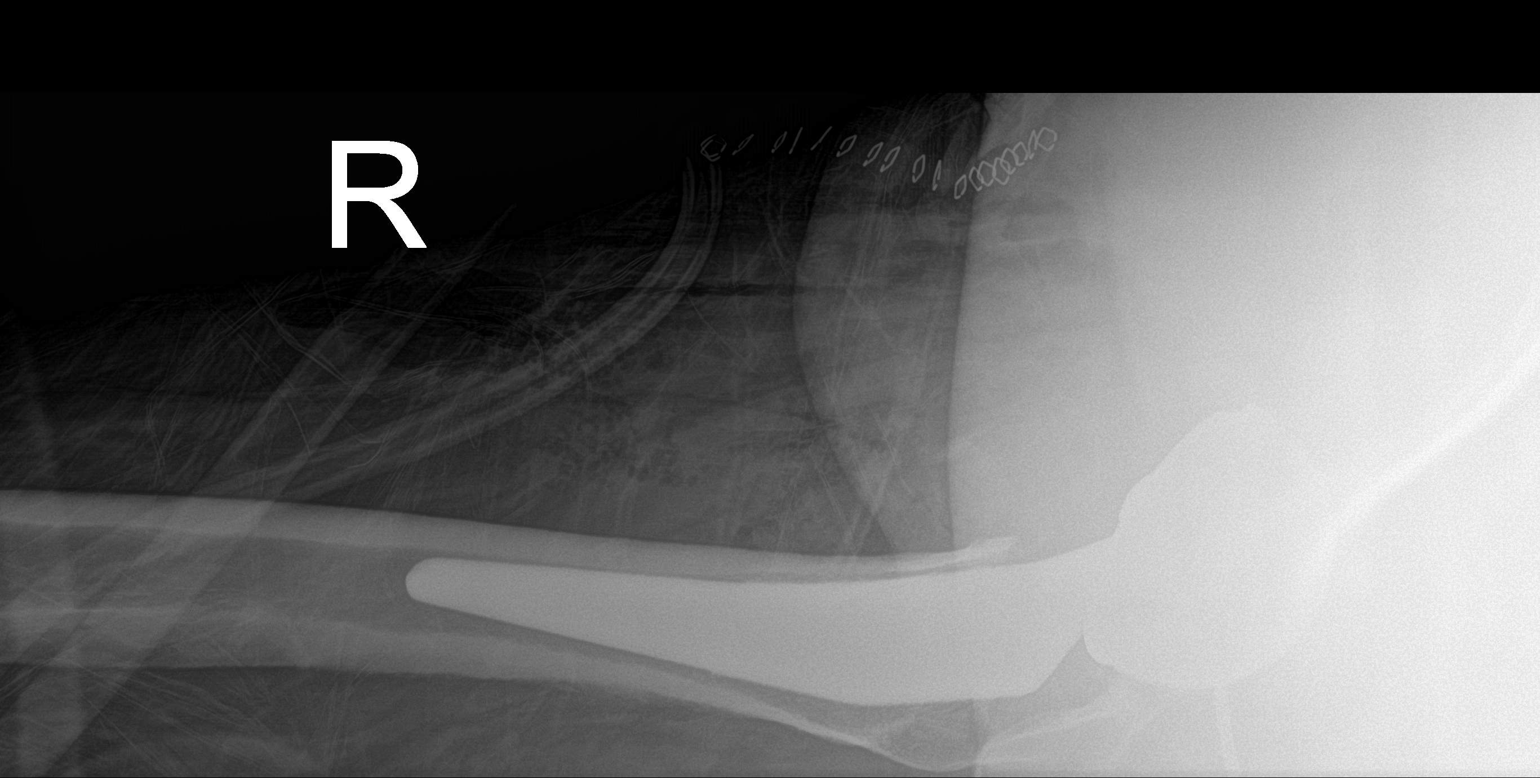

[2 of 2 positions shown; findings below may reference images not displayed]

FINDINGS: Status post right total hip arthroplasty with no hardware fracture
or loosening. No right hip dislocation. No bone fracture. No focal
osseous lesions. Expected soft tissue gas surrounding the right hip.
Vertical skin staples overlie the right hip.
IMPRESSION: Satisfactory immediate postoperative appearance status post right
total hip arthroplasty.

## 2022-01-12 IMAGING — RF DG HIP (WITH OR WITHOUT PELVIS) 1V*R*
1 series · 4 of 4 positions shown · non-contrast
Comparison: None Available.

CLINICAL DATA: Status post right total hip arthroplasty.

EXAM:
DG HIP (WITH OR WITHOUT PELVIS) 1V RIGHT

[Series 1: dg x-ray · 0.20mm/px · 4 of 4 slices shown]
[im 1/4]
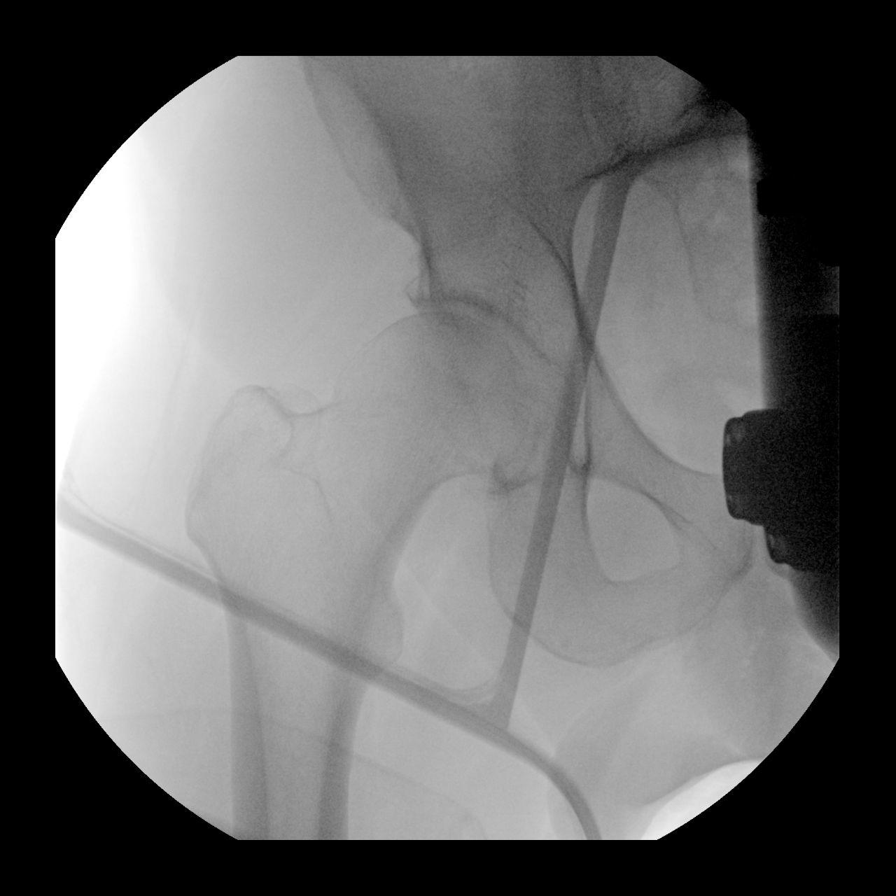
[im 2/4]
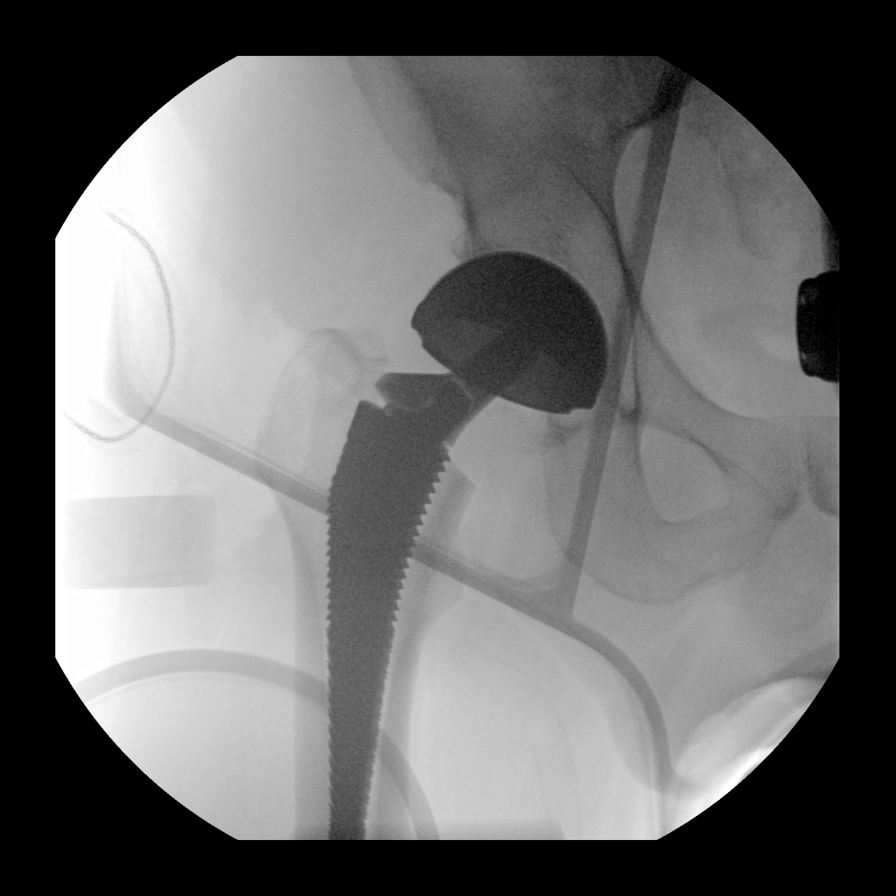
[im 3/4]
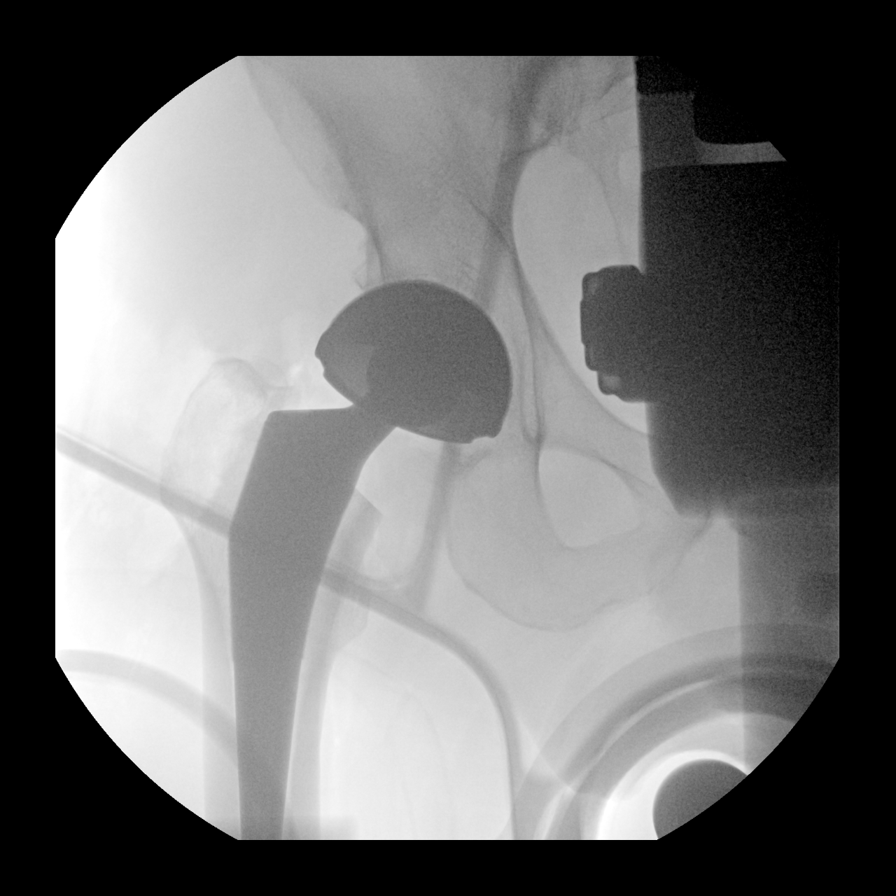
[im 4/4]
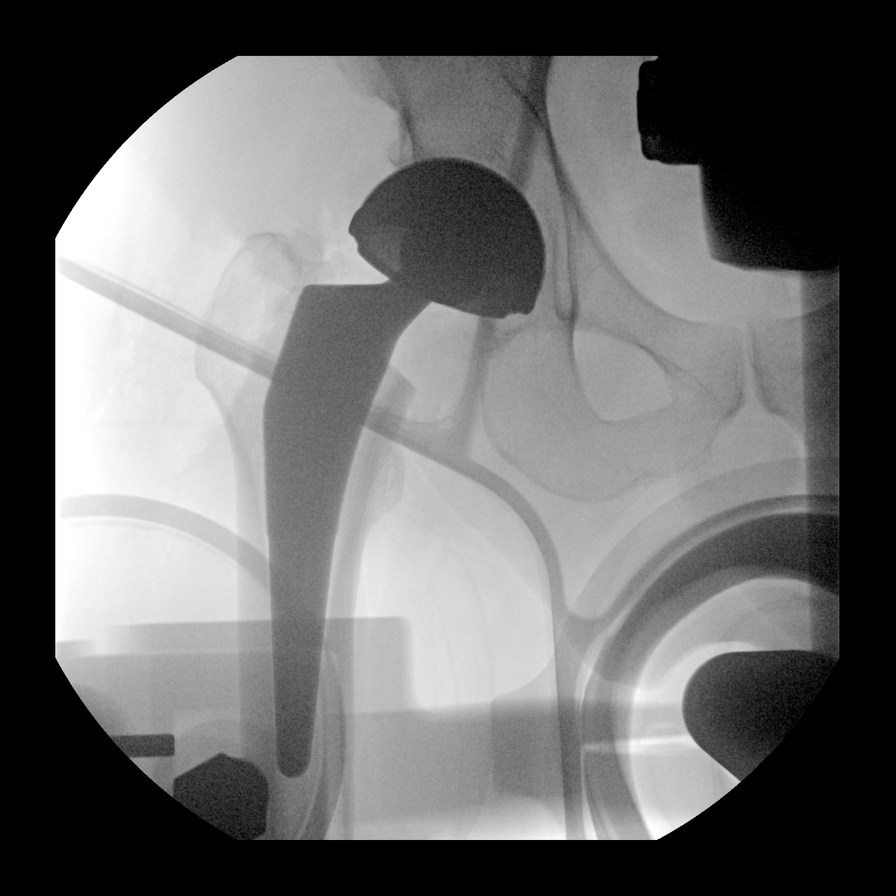

[4 of 4 positions shown; findings below may reference images not displayed]

FINDINGS: Four images obtained portably in the operating room via C-arm
radiography placement of a right total hip arthroplasty device. On
the final image the hardware components are in anatomic alignment
without signs of periprosthetic fracture or dislocation.
IMPRESSION: Status post right total hip arthroplasty.

## 2022-01-12 SURGERY — ARTHROPLASTY, HIP, TOTAL, ANTERIOR APPROACH
Anesthesia: General | Site: Hip | Laterality: Right

## 2022-01-12 MED ORDER — ONDANSETRON HCL 4 MG PO TABS
4.0000 mg | ORAL_TABLET | Freq: Four times a day (QID) | ORAL | Status: DC | PRN
  Administered 2022-01-13 (×2): 4 mg via ORAL
  Filled 2022-01-12 (×2): qty 1

## 2022-01-12 MED ORDER — PHENYLEPHRINE HCL-NACL 20-0.9 MG/250ML-% IV SOLN
INTRAVENOUS | Status: AC
  Filled 2022-01-12: qty 250

## 2022-01-12 MED ORDER — CEFAZOLIN SODIUM-DEXTROSE 2-4 GM/100ML-% IV SOLN
2.0000 g | Freq: Four times a day (QID) | INTRAVENOUS | Status: AC
  Administered 2022-01-12: 2 g via INTRAVENOUS
  Filled 2022-01-12 (×2): qty 100

## 2022-01-12 MED ORDER — ONDANSETRON HCL 4 MG/2ML IJ SOLN: 4.0000 mg | Freq: Four times a day (QID) | INTRAMUSCULAR | Status: DC | PRN

## 2022-01-12 MED ORDER — OXYCODONE HCL 5 MG PO TABS
10.0000 mg | ORAL_TABLET | ORAL | Status: DC | PRN
  Administered 2022-01-12 – 2022-01-13 (×2): 10 mg via ORAL
  Filled 2022-01-12 (×2): qty 2

## 2022-01-12 MED ORDER — BISACODYL 5 MG PO TBEC: 5.0000 mg | DELAYED_RELEASE_TABLET | Freq: Every day | ORAL | Status: DC | PRN

## 2022-01-12 MED ORDER — FLEET ENEMA 7-19 GM/118ML RE ENEM: 1.0000 | ENEMA | Freq: Once | RECTAL | Status: DC | PRN

## 2022-01-12 MED ORDER — DIPHENHYDRAMINE HCL 12.5 MG/5ML PO ELIX: 12.5000 mg | ORAL_SOLUTION | ORAL | Status: DC | PRN

## 2022-01-12 MED ORDER — MIDAZOLAM HCL 5 MG/5ML IJ SOLN
INTRAMUSCULAR | Status: DC | PRN
  Administered 2022-01-12: 2 mg via INTRAVENOUS

## 2022-01-12 MED ORDER — ENOXAPARIN SODIUM 40 MG/0.4ML IJ SOSY
40.0000 mg | PREFILLED_SYRINGE | INTRAMUSCULAR | Status: DC
  Administered 2022-01-13: 40 mg via SUBCUTANEOUS
  Filled 2022-01-12: qty 0.4

## 2022-01-12 MED ORDER — PROPOFOL 10 MG/ML IV BOLUS
INTRAVENOUS | Status: AC
  Filled 2022-01-12: qty 20

## 2022-01-12 MED ORDER — BUPIVACAINE-EPINEPHRINE 0.25% -1:200000 IJ SOLN
INTRAMUSCULAR | Status: DC | PRN
  Administered 2022-01-12: 90 mL via INTRAMUSCULAR

## 2022-01-12 MED ORDER — LORATADINE 10 MG PO TABS
10.0000 mg | ORAL_TABLET | Freq: Every day | ORAL | Status: DC
Start: 2022-01-12 — End: 2022-01-13
  Administered 2022-01-13: 10 mg via ORAL
  Filled 2022-01-12: qty 1

## 2022-01-12 MED ORDER — ALUM & MAG HYDROXIDE-SIMETH 200-200-20 MG/5ML PO SUSP: 30.0000 mL | ORAL | Status: DC | PRN

## 2022-01-12 MED ORDER — BUPIVACAINE HCL (PF) 0.5 % IJ SOLN: INTRAMUSCULAR | Status: DC | PRN

## 2022-01-12 MED ORDER — OXYCODONE HCL 5 MG/5ML PO SOLN: 5.0000 mg | Freq: Once | ORAL | Status: DC | PRN

## 2022-01-12 MED ORDER — FENTANYL CITRATE (PF) 100 MCG/2ML IJ SOLN: 25.0000 ug | INTRAMUSCULAR | Status: DC | PRN

## 2022-01-12 MED ORDER — METHOCARBAMOL 500 MG PO TABS
500.0000 mg | ORAL_TABLET | Freq: Four times a day (QID) | ORAL | Status: DC | PRN
Start: 2022-01-12 — End: 2022-01-13
  Administered 2022-01-13: 500 mg via ORAL
  Filled 2022-01-12: qty 1

## 2022-01-12 MED ORDER — 0.9 % SODIUM CHLORIDE (POUR BTL) OPTIME
TOPICAL | Status: DC | PRN
  Administered 2022-01-12: 1000 mL

## 2022-01-12 MED ORDER — HEMOSTATIC AGENTS (NO CHARGE) OPTIME
TOPICAL | Status: DC | PRN
  Administered 2022-01-12: 2 via TOPICAL

## 2022-01-12 MED ORDER — BUPIVACAINE-EPINEPHRINE (PF) 0.25% -1:200000 IJ SOLN
INTRAMUSCULAR | Status: AC
  Filled 2022-01-12: qty 30

## 2022-01-12 MED ORDER — KETAMINE HCL 10 MG/ML IJ SOLN
INTRAMUSCULAR | Status: DC | PRN
  Administered 2022-01-12 (×3): 10 mg via INTRAVENOUS

## 2022-01-12 MED ORDER — BUPIVACAINE HCL (PF) 0.5 % IJ SOLN
INTRAMUSCULAR | Status: AC
Start: 2022-01-12 — End: ?
  Filled 2022-01-12: qty 10

## 2022-01-12 MED ORDER — NEOMYCIN-POLYMYXIN B GU 40-200000 IR SOLN
Status: AC
  Filled 2022-01-12: qty 20

## 2022-01-12 MED ORDER — KETAMINE HCL 50 MG/5ML IJ SOSY
PREFILLED_SYRINGE | INTRAMUSCULAR | Status: AC
  Filled 2022-01-12: qty 5

## 2022-01-12 MED ORDER — PHENYLEPHRINE HCL-NACL 20-0.9 MG/250ML-% IV SOLN
INTRAVENOUS | Status: DC | PRN
  Administered 2022-01-12: 25 ug/min via INTRAVENOUS

## 2022-01-12 MED ORDER — FAMOTIDINE 20 MG PO TABS
ORAL_TABLET | ORAL | Status: AC
  Filled 2022-01-12: qty 1

## 2022-01-12 MED ORDER — OXYCODONE HCL 5 MG PO TABS: 5.0000 mg | ORAL_TABLET | Freq: Once | ORAL | Status: DC | PRN

## 2022-01-12 MED ORDER — ACETAMINOPHEN 10 MG/ML IV SOLN
INTRAVENOUS | Status: DC | PRN
  Administered 2022-01-12: 1000 mg via INTRAVENOUS

## 2022-01-12 MED ORDER — PROPOFOL 1000 MG/100ML IV EMUL
INTRAVENOUS | Status: AC
  Filled 2022-01-12: qty 100

## 2022-01-12 MED ORDER — EPHEDRINE 5 MG/ML INJ
INTRAVENOUS | Status: AC
  Filled 2022-01-12: qty 5

## 2022-01-12 MED ORDER — METHOCARBAMOL 1000 MG/10ML IJ SOLN
500.0000 mg | Freq: Four times a day (QID) | INTRAVENOUS | Status: DC | PRN
  Filled 2022-01-12: qty 5

## 2022-01-12 MED ORDER — OXYCODONE HCL 5 MG PO TABS
5.0000 mg | ORAL_TABLET | ORAL | Status: DC | PRN
  Administered 2022-01-12 – 2022-01-13 (×2): 5 mg via ORAL
  Filled 2022-01-12 (×2): qty 1

## 2022-01-12 MED ORDER — ATORVASTATIN CALCIUM 20 MG PO TABS
20.0000 mg | ORAL_TABLET | Freq: Every day | ORAL | Status: DC
  Administered 2022-01-13: 20 mg via ORAL
  Filled 2022-01-12: qty 1

## 2022-01-12 MED ORDER — FLUTICASONE PROPIONATE 50 MCG/ACT NA SUSP
1.0000 | Freq: Every day | NASAL | Status: DC | PRN
  Filled 2022-01-12: qty 16

## 2022-01-12 MED ORDER — ASPIRIN 81 MG PO CHEW
81.0000 mg | CHEWABLE_TABLET | Freq: Every day | ORAL | Status: DC
  Administered 2022-01-13: 81 mg via ORAL
  Filled 2022-01-12: qty 1

## 2022-01-12 MED ORDER — SODIUM CHLORIDE FLUSH 0.9 % IV SOLN
INTRAVENOUS | Status: AC
  Filled 2022-01-12: qty 20

## 2022-01-12 MED ORDER — ONDANSETRON HCL 4 MG/2ML IJ SOLN: 4.0000 mg | Freq: Once | INTRAMUSCULAR | Status: DC | PRN

## 2022-01-12 MED ORDER — PROPOFOL 500 MG/50ML IV EMUL
INTRAVENOUS | Status: DC | PRN
  Administered 2022-01-12: 100 ug/kg/min via INTRAVENOUS

## 2022-01-12 MED ORDER — MENTHOL 3 MG MT LOZG: 1.0000 | LOZENGE | OROMUCOSAL | Status: DC | PRN

## 2022-01-12 MED ORDER — EPHEDRINE SULFATE (PRESSORS) 50 MG/ML IJ SOLN
INTRAMUSCULAR | Status: DC | PRN
  Administered 2022-01-12 (×3): 5 mg via INTRAVENOUS
  Administered 2022-01-12: 10 mg via INTRAVENOUS

## 2022-01-12 MED ORDER — ACETAMINOPHEN 325 MG PO TABS: 325.0000 mg | ORAL_TABLET | Freq: Four times a day (QID) | ORAL | Status: DC | PRN

## 2022-01-12 MED ORDER — HYDROMORPHONE HCL 1 MG/ML IJ SOLN
0.5000 mg | INTRAMUSCULAR | Status: DC | PRN
  Administered 2022-01-12: 1 mg via INTRAVENOUS
  Filled 2022-01-12: qty 1

## 2022-01-12 MED ORDER — SURGIPHOR WOUND IRRIGATION SYSTEM - OPTIME: TOPICAL | Status: DC | PRN

## 2022-01-12 MED ORDER — MIDAZOLAM HCL 2 MG/2ML IJ SOLN
INTRAMUSCULAR | Status: AC
Start: 2022-01-12 — End: ?
  Filled 2022-01-12: qty 2

## 2022-01-12 MED ORDER — PROPOFOL 10 MG/ML IV BOLUS
INTRAVENOUS | Status: DC | PRN
  Administered 2022-01-12 (×2): 20 mg via INTRAVENOUS

## 2022-01-12 MED ORDER — ACETAMINOPHEN 500 MG PO TABS
1000.0000 mg | ORAL_TABLET | Freq: Four times a day (QID) | ORAL | Status: AC
  Administered 2022-01-12 – 2022-01-13 (×3): 1000 mg via ORAL
  Filled 2022-01-12 (×3): qty 2

## 2022-01-12 MED ORDER — ZOLPIDEM TARTRATE 5 MG PO TABS: 5.0000 mg | ORAL_TABLET | Freq: Every evening | ORAL | Status: DC | PRN

## 2022-01-12 MED ORDER — LACTATED RINGERS IV SOLN: INTRAVENOUS | Status: DC

## 2022-01-12 MED ORDER — PHENYLEPHRINE HCL (PRESSORS) 10 MG/ML IV SOLN
INTRAVENOUS | Status: DC | PRN
  Administered 2022-01-12: 240 ug via INTRAVENOUS
  Administered 2022-01-12: 160 ug via INTRAVENOUS
  Administered 2022-01-12 (×2): 240 ug via INTRAVENOUS
  Administered 2022-01-12: 160 ug via INTRAVENOUS

## 2022-01-12 MED ORDER — ACETAMINOPHEN 10 MG/ML IV SOLN
INTRAVENOUS | Status: AC
  Filled 2022-01-12: qty 100

## 2022-01-12 MED ORDER — METOCLOPRAMIDE HCL 5 MG PO TABS
5.0000 mg | ORAL_TABLET | Freq: Three times a day (TID) | ORAL | Status: DC | PRN
  Filled 2022-01-12: qty 2

## 2022-01-12 MED ORDER — METOCLOPRAMIDE HCL 5 MG/ML IJ SOLN: 5.0000 mg | Freq: Three times a day (TID) | INTRAMUSCULAR | Status: DC | PRN

## 2022-01-12 MED ORDER — GLYCOPYRROLATE 0.2 MG/ML IJ SOLN
INTRAMUSCULAR | Status: AC
  Filled 2022-01-12: qty 1

## 2022-01-12 MED ORDER — BUPIVACAINE LIPOSOME 1.3 % IJ SUSP
INTRAMUSCULAR | Status: AC
Start: 2022-01-12 — End: ?
  Filled 2022-01-12: qty 20

## 2022-01-12 MED ORDER — ACETAMINOPHEN 10 MG/ML IV SOLN: 1000.0000 mg | Freq: Once | INTRAVENOUS | Status: DC | PRN

## 2022-01-12 MED ORDER — TRAMADOL HCL 50 MG PO TABS
50.0000 mg | ORAL_TABLET | Freq: Four times a day (QID) | ORAL | Status: DC
  Administered 2022-01-12 – 2022-01-13 (×4): 50 mg via ORAL
  Filled 2022-01-12 (×4): qty 1

## 2022-01-12 MED ORDER — DOCUSATE SODIUM 100 MG PO CAPS
100.0000 mg | ORAL_CAPSULE | Freq: Two times a day (BID) | ORAL | Status: DC
  Administered 2022-01-12 – 2022-01-13 (×2): 100 mg via ORAL
  Filled 2022-01-12 (×2): qty 1

## 2022-01-12 MED ORDER — PANTOPRAZOLE SODIUM 40 MG PO TBEC
40.0000 mg | DELAYED_RELEASE_TABLET | Freq: Every day | ORAL | Status: DC
  Administered 2022-01-13: 40 mg via ORAL
  Filled 2022-01-12: qty 1

## 2022-01-12 MED ORDER — SODIUM CHLORIDE 0.9 % IV SOLN: INTRAVENOUS | Status: DC

## 2022-01-12 MED ORDER — SENNOSIDES-DOCUSATE SODIUM 8.6-50 MG PO TABS: 1.0000 | ORAL_TABLET | Freq: Every evening | ORAL | Status: DC | PRN

## 2022-01-12 MED ORDER — CEFAZOLIN SODIUM-DEXTROSE 2-4 GM/100ML-% IV SOLN
INTRAVENOUS | Status: AC
  Administered 2022-01-12: 2 g via INTRAVENOUS
  Filled 2022-01-12: qty 100

## 2022-01-12 MED ORDER — PHENOL 1.4 % MT LIQD: 1.0000 | OROMUCOSAL | Status: DC | PRN

## 2022-01-12 MED ORDER — GLYCOPYRROLATE 0.2 MG/ML IJ SOLN
INTRAMUSCULAR | Status: DC | PRN
  Administered 2022-01-12: .2 mg via INTRAVENOUS

## 2022-01-12 MED ORDER — BUPIVACAINE HCL (PF) 0.5 % IJ SOLN
INTRAMUSCULAR | Status: DC | PRN
  Administered 2022-01-12: 2.6 mL via INTRATHECAL

## 2022-01-12 MED ORDER — CHLORHEXIDINE GLUCONATE 0.12 % MT SOLN
OROMUCOSAL | Status: AC
  Filled 2022-01-12: qty 15

## 2022-01-12 SURGICAL SUPPLY — 56 items
BLADE SAGITTAL AGGR TOOTH XLG (BLADE) ×2 IMPLANT
BNDG COHESIVE 6X5 TAN ST LF (GAUZE/BANDAGES/DRESSINGS) ×5 IMPLANT
CANISTER WOUND CARE 500ML ATS (WOUND CARE) ×2 IMPLANT
CHLORAPREP W/TINT 26 (MISCELLANEOUS) ×2 IMPLANT
COVER BACK TABLE REUSABLE LG (DRAPES) ×2 IMPLANT
CUP ACETAB VERSA DBL 28X58 DMI (Orthopedic Implant) ×1 IMPLANT
DRAPE 3/4 80X56 (DRAPES) ×5 IMPLANT
DRAPE C-ARM XRAY 36X54 (DRAPES) ×2 IMPLANT
DRAPE INCISE IOBAN 66X60 STRL (DRAPES) IMPLANT
DRAPE POUCH INSTRU U-SHP 10X18 (DRAPES) ×2 IMPLANT
DRESSING SURGICEL FIBRLLR 1X2 (HEMOSTASIS) ×2 IMPLANT
DRSG MEPILEX SACRM 8.7X9.8 (GAUZE/BANDAGES/DRESSINGS) ×2 IMPLANT
DRSG SURGICEL FIBRILLAR 1X2 (HEMOSTASIS) ×4
ELECT BLADE 6.5 EXT (BLADE) ×2 IMPLANT
ELECT REM PT RETURN 9FT ADLT (ELECTROSURGICAL) ×2
ELECTRODE REM PT RTRN 9FT ADLT (ELECTROSURGICAL) ×1 IMPLANT
GLOVE BIOGEL PI IND STRL 9 (GLOVE) ×1 IMPLANT
GLOVE BIOGEL PI INDICATOR 9 (GLOVE) ×1
GLOVE SURG SYN 9.0  PF PI (GLOVE) ×2
GLOVE SURG SYN 9.0 PF PI (GLOVE) ×2 IMPLANT
GOWN SRG 2XL LVL 4 RGLN SLV (GOWNS) ×1 IMPLANT
GOWN STRL NON-REIN 2XL LVL4 (GOWNS) ×1
GOWN STRL REUS W/ TWL LRG LVL3 (GOWN DISPOSABLE) ×1 IMPLANT
GOWN STRL REUS W/TWL LRG LVL3 (GOWN DISPOSABLE) ×1
HEAD FEMORAL 28MM SZ M (Head) ×1 IMPLANT
HOLDER FOLEY CATH W/STRAP (MISCELLANEOUS) ×2 IMPLANT
HOOD PEEL AWAY FLYTE STAYCOOL (MISCELLANEOUS) ×2 IMPLANT
KIT PREVENA INCISION MGT 13 (CANNISTER) ×2 IMPLANT
MANIFOLD NEPTUNE II (INSTRUMENTS) ×2 IMPLANT
MASTERLOC HIP LATERAL S9 (Hips) ×1 IMPLANT
MAT ABSORB  FLUID 56X50 GRAY (MISCELLANEOUS) ×1
MAT ABSORB FLUID 56X50 GRAY (MISCELLANEOUS) ×1 IMPLANT
NDL SPNL 20GX3.5 QUINCKE YW (NEEDLE) ×2 IMPLANT
NEEDLE SPNL 20GX3.5 QUINCKE YW (NEEDLE) ×4 IMPLANT
NS IRRIG 1000ML POUR BTL (IV SOLUTION) ×2 IMPLANT
PACK HIP COMPR (MISCELLANEOUS) ×2 IMPLANT
SCALPEL PROTECTED #10 DISP (BLADE) ×4 IMPLANT
SHELL ACETABULAR SZ0 58MM (Shell) ×1 IMPLANT
SOLUTION IRRIG SURGIPHOR (IV SOLUTION) ×1 IMPLANT
STAPLER SKIN PROX 35W (STAPLE) ×2 IMPLANT
STRAP SAFETY 5IN WIDE (MISCELLANEOUS) ×2 IMPLANT
SUT DVC 2 QUILL PDO  T11 36X36 (SUTURE) ×1
SUT DVC 2 QUILL PDO T11 36X36 (SUTURE) ×1 IMPLANT
SUT SILK 0 (SUTURE) ×1
SUT SILK 0 30XBRD TIE 6 (SUTURE) ×1 IMPLANT
SUT V-LOC 90 ABS DVC 3-0 CL (SUTURE) ×2 IMPLANT
SUT VIC AB 1 CT1 36 (SUTURE) ×2 IMPLANT
SYR 20ML LL LF (SYRINGE) ×2 IMPLANT
SYR 30ML LL (SYRINGE) ×2 IMPLANT
SYR 50ML LL SCALE MARK (SYRINGE) ×4 IMPLANT
SYR BULB IRRIG 60ML STRL (SYRINGE) ×2 IMPLANT
TAPE MICROFOAM 4IN (TAPE) ×2 IMPLANT
TAPE TRANSPORE STRL 2 31045 (GAUZE/BANDAGES/DRESSINGS) ×1 IMPLANT
TOWEL OR 17X26 4PK STRL BLUE (TOWEL DISPOSABLE) IMPLANT
TRAY FOLEY MTR SLVR 16FR STAT (SET/KITS/TRAYS/PACK) ×2 IMPLANT
WATER STERILE IRR 1000ML POUR (IV SOLUTION) ×1 IMPLANT

## 2022-01-12 NOTE — Anesthesia Preprocedure Evaluation (Addendum)
Anesthesia Evaluation  Patient identified by MRN, date of birth, ID band Patient awake    Reviewed: Allergy & Precautions, NPO status , Patient's Chart, lab work & pertinent test results  History of Anesthesia Complications Negative for: history of anesthetic complications  Airway Mallampati: II   Neck ROM: Full    Dental  (+) Loose   Pulmonary neg pulmonary ROS,    Pulmonary exam normal breath sounds clear to auscultation       Cardiovascular Normal cardiovascular exam Rhythm:Regular Rate:Normal  ECG 12/31/21: normal  Echo 11/26/21: NORMAL LEFT VENTRICULAR SYSTOLIC FUNCTION WITH AN ESTIMATED EF = >55 % NORMAL RIGHT VENTRICULAR SYSTOLIC FUNCTION MILD TRICUSPID AND MITRAL VALVE INSUFFICIENCY TRACE AORTIC VALVE INSUFFICIENCY NO VALVULAR STENOSIS MILD RV ENLARGEMENT MILD BIATRIAL ENLARGEMENT  Myocardial Perfusion 11/24/21: Borderline myocardial perfusion scan is a small borderline anterior defect very mild no clear definitive evidence of ischemia ejection fraction around 61% with normal wall motion. Conclusion borderline Myoview base further evaluation on symptoms     Neuro/Psych negative neurological ROS     GI/Hepatic GERD  ,  Endo/Other  Obesity   Renal/GU negative Renal ROS     Musculoskeletal  (+) Arthritis ,   Abdominal   Peds  Hematology negative hematology ROS (+)   Anesthesia Other Findings Cardiology note 12/09/21:  Plan  1. Irregular heart rhythm Intermittent, fairly stable.  - I reviewed Holter and results looked okay. - -Recommend decreasing caffeine intake, avoiding the consumption of energy drinks, staying adequately hydrated with water and electrolytes daily and practicing healthy anxiety management. -Recommend sleep study.  2. Intermittent chest pain Fairly stable.  -Lexi/myoview test looked okay. -Continue aspirin 81 mg by mouth once daily. =-Recommend following heart healthy diet.  3.  Dyspnea Stable. Recurrent with irregular heart rhythms. -Echo looked normal.  4. Hyperlipidemia Reasonably controlled. -Continue atorvastatin therapy.  5. Diet-controlled diabetes mellitus type 2 -Management per PCP.  6. Obesity class I -Lifestyle changes as recommended above.  7. Clearance for Work - Pt cleared to return to work  Follow up in 3 months.   Reproductive/Obstetrics                            Anesthesia Physical Anesthesia Plan  ASA: 2  Anesthesia Plan: General and Spinal   Post-op Pain Management:    Induction: Intravenous  PONV Risk Score and Plan: 2 and Propofol infusion, TIVA, Treatment may vary due to age or medical condition and Ondansetron  Airway Management Planned: Natural Airway and Nasal Cannula  Additional Equipment:   Intra-op Plan:   Post-operative Plan:   Informed Consent: I have reviewed the patients History and Physical, chart, labs and discussed the procedure including the risks, benefits and alternatives for the proposed anesthesia with the patient or authorized representative who has indicated his/her understanding and acceptance.       Plan Discussed with: CRNA  Anesthesia Plan Comments: (Plan for spinal and GA with natural airway, LMA/GETA backup.  Patient consented for risks of anesthesia including but not limited to:  - adverse reactions to medications - damage to eyes, teeth, lips or other oral mucosa - nerve damage due to positioning  - sore throat or hoarseness - headache, bleeding, infection, nerve damage 2/2 spinal - damage to heart, brain, nerves, lungs, other parts of body or loss of life  Informed patient about role of CRNA in peri- and intra-operative care.  Patient voiced understanding.)  Anesthesia Quick Evaluation  
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
## 2022-01-12 NOTE — Anesthesia Procedure Notes (Signed)
Spinal  Patient location during procedure: OR Start time: 01/12/2022 9:55 AM End time: 01/12/2022 10:00 AM Reason for block: surgical anesthesia Staffing Performed: resident/CRNA  Resident/CRNA: Lia Foyer, CRNA Preanesthetic Checklist Completed: patient identified, IV checked, site marked, risks and benefits discussed, surgical consent, monitors and equipment checked, pre-op evaluation and timeout performed Spinal Block Patient position: sitting Prep: ChloraPrep Patient monitoring: heart rate, cardiac monitor, continuous pulse ox and blood pressure Approach: midline Location: L3-4 Injection technique: single-shot Needle Needle type: Pencan  Needle gauge: 24 G Needle length: 9 cm Assessment Sensory level: T4 Events: CSF return

## 2022-01-12 NOTE — Op Note (Signed)
01/12/2022  11:30 AM  PATIENT:  Gary Mendez  56 y.o. male  PRE-OPERATIVE DIAGNOSIS:  Primary osteoarthritis of right hip  M16.11  POST-OPERATIVE DIAGNOSIS:  Primary osteoarthritis of right hip  M16.11  PROCEDURE:  Procedure(s): TOTAL HIP ARTHROPLASTY ANTERIOR APPROACH (Right)  SURGEON: Laurene Footman, MD  ASSISTANTS: None  ANESTHESIA:   spinal  EBL:  Total I/O In: 100 [IV Piggyback:100] Out: 225 [Urine:225]  BLOOD ADMINISTERED:none  DRAINS:  Incisional wound VAC    LOCAL MEDICATIONS USED:  MARCAINE    and OTHER Exparel  SPECIMEN:  Source of Specimen:    Right femoral head  DISPOSITION OF SPECIMEN:  PATHOLOGY  COUNTS:  YES  TOURNIQUET:  * No tourniquets in log *  IMPLANTS: Medacta Masterloc 9 LAT stem with 58 mm Mpact DM cup and liner ceramic M 28 mm head  DICTATION: .Dragon Dictation   The patient was brought to the operating room and after spinal anesthesia was obtained patient was placed on the operative table with the ipsilateral foot into the Medacta attachment, contralateral leg on a well-padded table. C-arm was brought in and preop template x-ray taken. After prepping and draping in usual sterile fashion appropriate patient identification and timeout procedures were completed. Anterior approach to the hip was obtained and centered over the greater trochanter and TFL muscle. The subcutaneous tissue was incised hemostasis being achieved by electrocautery. TFL fascia was incised and the muscle retracted laterally deep retractor placed. The lateral femoral circumflex vessels were identified and ligated. The anterior capsule was exposed and a capsulotomy performed. The neck was identified and a femoral neck cut carried out with a saw. The head was removed without difficulty and showed sclerotic femoral head and acetabulum. Reaming was carried out to 58 mm and a 58 mm cup trial gave appropriate tightness to the acetabular component a 58 DM cup was impacted into position. The  leg was then externally rotated and ischiofemoral and pubofemoral releases carried out. The femur was sequentially broached to a size 9, size 9 LAT with S head trials were placed and the final components chosen. The 9 LAT stem was inserted along with a ceramic M 28 mm head and 58 mm liner. The hip was reduced and was stable the wound was thoroughly irrigated with fibrillar placed along the posterior capsule and medial neck. The deep fascia ws closed using a heavy Quill after infiltration of 30 cc of quarter percent Sensorcaine with epinephrine diluted with Exparel throughout the case.  Marland Kitchen3-0 V-loc to close the skin with skin staples.  Incisional wound VAC applied and patient was sent to recovery in stable condition.   PLAN OF CARE: Admit for overnight observation

## 2022-01-12 NOTE — Transfer of Care (Signed)
Immediate Anesthesia Transfer of Care Note  Patient: Gary Mendez  Procedure(s) Performed: TOTAL HIP ARTHROPLASTY ANTERIOR APPROACH (Right: Hip)  Patient Location: PACU  Anesthesia Type:General and Spinal  Level of Consciousness: drowsy  Airway & Oxygen Therapy: Patient Spontanous Breathing and Patient connected to face mask oxygen  Post-op Assessment: Report given to RN and Post -op Vital signs reviewed and stable  Post vital signs: Reviewed and stable  Last Vitals:  Vitals Value Taken Time  BP 124/80 01/12/22 1130  Temp    Pulse 73 01/12/22 1131  Resp 15 01/12/22 1131  SpO2 95 % 01/12/22 1131  Vitals shown include unvalidated device data.  Last Pain:  Vitals:   01/12/22 0829  TempSrc: Temporal  PainSc: 6          Complications: No notable events documented.

## 2022-01-12 NOTE — Progress Notes (Cosign Needed)
Patient is not able to walk the distance required to go the bathroom, or he/she is unable to safely negotiate stairs required to access the bathroom.  A 3in1 BSC will alleviate this problem  

## 2022-01-12 NOTE — Evaluation (Signed)
Physical Therapy Evaluation Patient Details Name: Gary Mendez MRN: [id number] DOB: 05-29-1966 Today's Date: 01/12/2022  History of Present Illness  56 y/o male s/p R total hip replacement, anterior approach.  Clinical Impression  Pt did well with PT exam on POD0.  He had expected post-op pain but was not limited by this and showed great effort and motivation t/o session.  He was able to do most exercises against gravity/resistance (except SLRs) and was able to ambulate 100 ft with safe and consistent cadence.  Pt at or exceeding typical POD0 expectations.     Recommendations for follow up therapy are one component of a multi-disciplinary discharge planning process, led by the attending physician.  Recommendations may be updated based on patient status, additional functional criteria and insurance authorization.  Follow Up Recommendations Follow physician's recommendations for discharge plan and follow up therapies    Assistance Recommended at Discharge PRN  Patient can return home with the following  Assistance with cooking/housework;Assist for transportation    Equipment Recommendations  (RW and 3-in-1 already ordered)  Recommendations for Other Services       Functional Status Assessment Patient has had a recent decline in their functional status and demonstrates the ability to make significant improvements in function in a reasonable and predictable amount of time.     Precautions / Restrictions Precautions Precautions: Anterior Hip;Fall Restrictions Weight Bearing Restrictions: Yes RLE Weight Bearing: Weight bearing as tolerated      Mobility  Bed Mobility Overal bed mobility: Modified Independent             General bed mobility comments: light assist with UEs to ease R LE toward EOB, but no assist to actually transition    Transfers Overall transfer level: Modified independent Equipment used: Rolling walker (2 wheels)               General transfer  comment: good ability to shift weight forward and maintain standing with minimal UE reliance    Ambulation/Gait Ambulation/Gait assistance: Supervision Gait Distance (Feet): 100 Feet Assistive device: Rolling walker (2 wheels)         General Gait Details: Pt with minimal initial hesitation and was quickly able to assume consistent cadence, showed little to no limp/hesitation and generally speaking did very will with first walk POD0  Stairs            Wheelchair Mobility    Modified Rankin (Stroke Patients Only)       Balance Overall balance assessment: Modified Independent                                           Pertinent Vitals/Pain Pain Assessment Pain Assessment: 0-10 Pain Score: 3  Pain Location: R hip    Home Living Family/patient expects to be discharged to:: Private residence Living Arrangements: Spouse/significant other;Children Available Help at Discharge: Family Type of Home: House Home Access: Ramped entrance       Home Layout: One level Home Equipment: Conservation officer, nature (2 wheels);Cane - single point      Prior Function Prior Level of Function : Independent/Modified Independent;Working/employed             Mobility Comments: Pt works maintainance, lots of climbing, lifting, squating, etc ADLs Comments: independent     Hand Dominance        Extremity/Trunk Assessment   Upper Extremity Assessment Upper Extremity Assessment: Generalized  weakness    Lower Extremity Assessment Lower Extremity Assessment: Generalized weakness (expected post-op weakness)       Communication   Communication: No difficulties  Cognition Arousal/Alertness: Awake/alert Behavior During Therapy: WFL for tasks assessed/performed Overall Cognitive Status: Within Functional Limits for tasks assessed                                          General Comments      Exercises Total Joint Exercises Ankle Circles/Pumps:  AROM, 10 reps Quad Sets: Strengthening, 10 reps Gluteal Sets: AROM, 10 reps Short Arc Quad: Strengthening, 10 reps Heel Slides: AROM, 10 reps (with resisted leg ext) Hip ABduction/ADduction: Strengthening, 10 reps, AROM   Assessment/Plan    PT Assessment Patient needs continued PT services  PT Problem List Decreased strength;Decreased range of motion;Decreased activity tolerance;Decreased balance;Decreased mobility;Decreased knowledge of use of DME;Decreased safety awareness;Decreased knowledge of precautions       PT Treatment Interventions DME instruction;Gait training;Stair training;Functional mobility training;Therapeutic activities;Therapeutic exercise;Balance training;Neuromuscular re-education;Patient/family education    PT Goals (Current goals can be found in the Care Plan section)  Acute Rehab PT Goals Patient Stated Goal: go home PT Goal Formulation: With patient Time For Goal Achievement: 01/26/22 Potential to Achieve Goals: Good    Frequency BID     Co-evaluation               AM-PAC PT "6 Clicks" Mobility  Outcome Measure Help needed turning from your back to your side while in a flat bed without using bedrails?: None Help needed moving from lying on your back to sitting on the side of a flat bed without using bedrails?: None Help needed moving to and from a bed to a chair (including a wheelchair)?: None Help needed standing up from a chair using your arms (e.g., wheelchair or bedside chair)?: None Help needed to walk in hospital room?: None Help needed climbing 3-5 steps with a railing? : A Little 6 Click Score: 23    End of Session Equipment Utilized During Treatment: Gait belt Activity Tolerance: Patient tolerated treatment well Patient left: with chair alarm set;with call bell/phone within reach Nurse Communication: Mobility status PT Visit Diagnosis: Muscle weakness (generalized) (M62.81);Difficulty in walking, not elsewhere classified (R26.2)     Time: 1408-1500 PT Time Calculation (min) (ACUTE ONLY): 52 min   Charges:   PT Evaluation $PT Eval Low Complexity: 1 Low PT Treatments $Gait Training: 8-22 mins $Therapeutic Exercise: 8-22 mins        Kreg Shropshire, DPT 01/12/2022, 3:26 PM

## 2022-01-12 NOTE — Anesthesia Postprocedure Evaluation (Signed)
Anesthesia Post Note  Patient: Gary Mendez  Procedure(s) Performed: TOTAL HIP ARTHROPLASTY ANTERIOR APPROACH (Right: Hip)  Patient location during evaluation: PACU Anesthesia Type: Spinal Level of consciousness: awake and alert, oriented and patient cooperative Pain management: pain level controlled Vital Signs Assessment: post-procedure vital signs reviewed and stable Respiratory status: spontaneous breathing, nonlabored ventilation and respiratory function stable Cardiovascular status: blood pressure returned to baseline and stable Postop Assessment: adequate PO intake, no headache and spinal receding Anesthetic complications: no   No notable events documented.   Last Vitals:  Vitals:   01/12/22 1145 01/12/22 1200  BP: 100/74 92/68  Pulse: 96 81  Resp: 17 17  Temp:  (!) 36.1 C  SpO2: 94% 97%    Last Pain:  Vitals:   01/12/22 1200  TempSrc:   PainSc: 0-No pain                 Darrin Nipper

## 2022-01-12 NOTE — Plan of Care (Signed)
Pt admitted to floor and assessed as per documentation. No signs or symptoms of distress; ambulation pending feeling to return completely to legs. Will continue to monitor. Family at bedside.

## 2022-01-12 NOTE — Progress Notes (Signed)
Met with the patient at the bedside with his wife He needs a RW and a 3 in 1 [employer] to deliver to the bedside He has transportation with his wife He can afford his medication Per  notes in chart on April 27th by Dr Rudene Christians he will not need PT until after staple removal.

## 2022-01-13 DIAGNOSIS — M1611 Unilateral primary osteoarthritis, right hip: Secondary | ICD-10-CM | POA: Diagnosis not present

## 2022-01-13 LAB — BASIC METABOLIC PANEL
Anion gap: 6 (ref 5–15)
BUN: 10 mg/dL (ref 6–20)
CO2: 27 mmol/L (ref 22–32)
Calcium: 8.5 mg/dL — ABNORMAL LOW (ref 8.9–10.3)
Chloride: 104 mmol/L (ref 98–111)
Creatinine, Ser: 0.83 mg/dL (ref 0.61–1.24)
GFR, Estimated: >60 mL/min (ref >60)
Glucose, Bld: 166 mg/dL — ABNORMAL HIGH (ref 70–99)
Potassium: 4.2 mmol/L (ref 3.5–5.1)
Sodium: 137 mmol/L (ref 135–145)

## 2022-01-13 LAB — CBC
HCT: 41.7 % (ref 39.0–52.0)
Hemoglobin: 13.9 g/dL (ref 13.0–17.0)
MCH: 28.3 pg (ref 26.0–34.0)
MCHC: 33.3 g/dL (ref 30.0–36.0)
MCV: 84.9 fL (ref 80.0–100.0)
Platelets: 225 10*3/uL (ref 150–400)
RBC: 4.91 MIL/uL (ref 4.22–5.81)
RDW: 12.7 % (ref 11.5–15.5)
WBC: 13.5 10*3/uL — ABNORMAL HIGH (ref 4.0–10.5)
nRBC: 0 % (ref 0.0–0.2)

## 2022-01-13 MED ORDER — TRAMADOL HCL 50 MG PO TABS: 50.0000 mg | ORAL_TABLET | Freq: Four times a day (QID) | ORAL | 0 refills | Status: AC | PRN

## 2022-01-13 MED ORDER — METHOCARBAMOL 500 MG PO TABS
500.0000 mg | ORAL_TABLET | Freq: Four times a day (QID) | ORAL | 0 refills | Status: AC | PRN
Start: 2022-01-13 — End: ?

## 2022-01-13 MED ORDER — ONDANSETRON HCL 4 MG PO TABS: 4.0000 mg | ORAL_TABLET | Freq: Four times a day (QID) | ORAL | 0 refills | Status: AC | PRN

## 2022-01-13 MED ORDER — OXYCODONE HCL 5 MG PO TABS: 5.0000 mg | ORAL_TABLET | ORAL | 0 refills | Status: AC | PRN

## 2022-01-13 MED ORDER — ENOXAPARIN SODIUM 40 MG/0.4ML IJ SOSY
40.0000 mg | PREFILLED_SYRINGE | INTRAMUSCULAR | 0 refills | Status: AC
Start: 2022-01-13 — End: 2022-01-27

## 2022-01-13 NOTE — TOC Progression Note (Signed)
Transition of Care [facility]) - Progression Note    Patient Details  Name: Gary Mendez MRN: [id number] Date of Birth: 09/06/1965  Transition of Care [facility]) CM/SW [place], RN Phone Number: 01/13/2022, 10:16 AM  Clinical Narrative:    I notified the patient that his copay for [facility] PT is 130-150$ per visit, he stated to skip it, [facility], I notified the Dr   Expected Discharge Plan: [place] Barriers to Discharge: Continued Medical Work up  Expected Discharge Plan and Services Expected Discharge Plan: [place]   Discharge Planning Services: CM Consult   Living arrangements for the past 2 months: Single Family Home                 DME Arranged: Gilford Rile rolling, 3-N-1 DME Agency: [employer] Date DME Agency Contacted: 01/12/22 Time DME Agency Contacted: [phone] Representative spoke with at DME Agency: [facility] HH Arranged: PT           Social Determinants of Health ([place]) Interventions    Readmission Risk Interventions     View : No data to display.
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
## 2022-01-13 NOTE — Discharge Instructions (Signed)

## 2022-01-13 NOTE — Plan of Care (Signed)

## 2022-01-13 NOTE — Progress Notes (Signed)
Physical Therapy Treatment Patient Details Name: Gary Mendez MRN: [id number] DOB: April 08, 1966 Today's Date: 01/13/2022   History of Present Illness 56 y/o male s/p R total hip replacement, anterior approach.    PT Comments    Pt eager to work with PT and ultimately did very well.  He has some expected pain/difficulty with hip flexion tasks, but showed great effort and functional strength with all LE exercises.  He was able to safely and confidently circumambulate the nurses' station and negotiate up/down steps w/o issue.  Pt doing well, ready to d/c from PT stand-point.    Recommendations for follow up therapy are one component of a multi-disciplinary discharge planning process, led by the attending physician.  Recommendations may be updated based on patient status, additional functional criteria and insurance authorization.  Follow Up Recommendations  Follow physician's recommendations for discharge plan and follow up therapies     Assistance Recommended at Discharge PRN  Patient can return home with the following Assistance with cooking/housework;Assist for transportation   Equipment Recommendations  Rolling walker (2 wheels);BSC/3in1    Recommendations for Other Services       Precautions / Restrictions Precautions Precautions: Anterior Hip;Fall Restrictions RLE Weight Bearing: Weight bearing as tolerated     Mobility  Bed Mobility               General bed mobility comments: in recliner on arrival    Transfers Overall transfer level: Modified independent Equipment used: Rolling walker (2 wheels)               General transfer comment: cues/reminders to use UEs appropriately but able to easily rise w/o hesitation    Ambulation/Gait Ambulation/Gait assistance: Modified independent (Device/Increase time) Gait Distance (Feet): 250 Feet Assistive device: Rolling walker (2 wheels)         General Gait Details: Pt again able to quickly assume  consistent and confident cadence, appropriate reliance on the walker with good speed and safety.   Stairs Stairs: Yes Stairs assistance: Supervision Stair Management: Two rails, No rails, Backwards, Forwards Number of Stairs: 8 General stair comments: up/down 4 steps with rails and 4 steps with walker using retro strategy.  Pt effective and safe with each   Wheelchair Mobility    Modified Rankin (Stroke Patients Only)       Balance Overall balance assessment: Modified Independent                                          Cognition Arousal/Alertness: Awake/alert Behavior During Therapy: WFL for tasks assessed/performed Overall Cognitive Status: Within Functional Limits for tasks assessed                                          Exercises Total Joint Exercises Ankle Circles/Pumps: AROM, 10 reps Short Arc Quad: Strengthening, 10 reps Heel Slides: AROM, 10 reps (with resisted leg ext) Hip ABduction/ADduction: Strengthening, 10 reps, AROM Marching in Standing: Seated, AROM, 10 reps    General Comments        Pertinent Vitals/Pain Pain Assessment Pain Assessment: 0-10 Pain Score: 4  Pain Location: R hip - minimal at rest    Home Living  Prior Function            PT Goals (current goals can now be found in the care plan section) Progress towards PT goals: Progressing toward goals    Frequency    BID      PT Plan Current plan remains appropriate    Co-evaluation              AM-PAC PT "6 Clicks" Mobility   Outcome Measure  Help needed turning from your back to your side while in a flat bed without using bedrails?: None Help needed moving from lying on your back to sitting on the side of a flat bed without using bedrails?: None Help needed moving to and from a bed to a chair (including a wheelchair)?: None Help needed standing up from a chair using your arms (e.g., wheelchair or  bedside chair)?: None Help needed to walk in hospital room?: None Help needed climbing 3-5 steps with a railing? : None 6 Click Score: 24    End of Session Equipment Utilized During Treatment: Gait belt Activity Tolerance: Patient tolerated treatment well Patient left: with chair alarm set;with call bell/phone within reach;with nursing/sitter in room Nurse Communication: Mobility status PT Visit Diagnosis: Muscle weakness (generalized) (M62.81);Difficulty in walking, not elsewhere classified (R26.2)     Time: 3893-7342 PT Time Calculation (min) (ACUTE ONLY): 29 min  Charges:  $Gait Training: 8-22 mins $Therapeutic Exercise: 8-22 mins                     Kreg Shropshire, DPT 01/13/2022, 10:15 AM

## 2022-01-13 NOTE — Progress Notes (Signed)
Occupational Therapy Evaluation Patient Details Name: Gary Mendez MRN: [id number] DOB: 22-Jul-1966 Today's Date: 01/13/2022   History of Present Illness 56 y/o male s/p R total hip replacement, anterior approach.   Clinical Impression   Pt was seen for OT evaluation this date. Prior to hospital admission, pt was independent in mobility and ADLs. Pt lives in a one level house with a ramp entrance with wife and son. Pt reported house is wheelchair accessible to meet his sons needs. Pt reported 3/10 pain at rest and 7/10 during ambulation. Pt currently requires MIN A for lower body dressing- needs assistance to thread/unthread pants/underpants and don/doff socks. Independent with upper body dressing. MOD I + RW for mobility and grooming tasks. Pt educated on anterior hip precautions. All education complete, will sign off. Upon hospital discharge, recommend no OT follow up.     Recommendations for follow up therapy are one component of a multi-disciplinary discharge planning process, led by the attending physician.  Recommendations may be updated based on patient status, additional functional criteria and insurance authorization.   Follow Up Recommendations  No OT follow up    Assistance Recommended at Discharge Set up Supervision/Assistance  Patient can return home with the following Assist for transportation;Assistance with cooking/housework;A little help with bathing/dressing/bathroom    Functional Status Assessment  Patient has had a recent decline in their functional status and demonstrates the ability to make significant improvements in function in a reasonable and predictable amount of time.  Equipment Recommendations  BSC/3in1    Recommendations for Other Services       Precautions / Restrictions Precautions Precautions: Anterior Hip;Fall Restrictions Weight Bearing Restrictions: Yes RLE Weight Bearing: Weight bearing as tolerated      Mobility Bed Mobility Overal bed  mobility:  (Not assessed)             General bed mobility comments: in recliner on arrival    Transfers Overall transfer level: Modified independent Equipment used: Rolling walker (2 wheels)                      Balance Overall balance assessment: Modified Independent                                         ADL either performed or assessed with clinical judgement   ADL Overall ADL's : Needs assistance/impaired                                       General ADL Comments: MIN A for lower body dressing- needs assistance to thread/unthread pants/underpants and don/doff socks. Independent with upper body dressing. MOD I + RW for grooming tasks.      Pertinent Vitals/Pain Pain Assessment Pain Assessment: 0-10 Pain Score: 3  Pain Location: R hip- Pt reported pain was a 3 at rest and 7 during ambulation. Pain Descriptors / Indicators: Grimacing Pain Intervention(s): Repositioned        Extremity/Trunk Assessment Upper Extremity Assessment Upper Extremity Assessment: Overall WFL for tasks assessed   Lower Extremity Assessment Lower Extremity Assessment: Generalized weakness       Communication Communication Communication: No difficulties   Cognition Arousal/Alertness: Awake/alert Behavior During Therapy: WFL for tasks assessed/performed Overall Cognitive Status: Within Functional Limits for tasks assessed  Home Living Family/patient expects to be discharged to:: Private residence Living Arrangements: Spouse/significant other;Children Available Help at Discharge: Family Type of Home: House Home Access: [place]: One level     Bathroom Shower/Tub: Occupational psychologist: Handicapped height Bathroom Accessibility: Yes How Accessible: Accessible via wheelchair [place]: Conservation officer, nature (2 wheels);Cane - single  point          Prior Functioning/Environment Prior Level of Function : Independent/Modified Independent;Working/employed             Mobility Comments: Pt works maintainance, lots of climbing, lifting, squating, etc ADLs Comments: independent        OT Problem List: Decreased strength;Decreased range of motion;Decreased activity tolerance         OT Goals(Current goals can be found in the care plan section) Acute Rehab OT Goals Patient Stated Goal: To return home and help with caring for his son OT Goal Formulation: With patient Time For Goal Achievement: 01/27/22 Potential to Achieve Goals: Good  OT Frequency:         AM-PAC OT "6 Clicks" Daily Activity     Outcome Measure Help from another person eating meals?: None Help from another person taking care of personal grooming?: None Help from another person toileting, which includes using toliet, bedpan, or urinal?: A Little Help from another person bathing (including washing, rinsing, drying)?: A Little Help from another person to put on and taking off regular upper body clothing?: None Help from another person to put on and taking off regular lower body clothing?: A Little 6 Click Score: 21   End of Session Equipment Utilized During Treatment: Rolling walker (2 wheels)  Activity Tolerance: Patient tolerated treatment well Patient left: in chair;with call bell/phone within reach;with SCD's reapplied  OT Visit Diagnosis: Unsteadiness on feet (R26.81)                Time: 6834-1962 OT Time Calculation (min): 23 min Charges:  OT General Charges $OT Visit: 1 Visit OT Evaluation $OT Eval Low Complexity: 1 Low OT Treatments $Self Care/Home Management : 8-22 mins  [employer] OTDS  [employer] 01/13/2022, 11:56 AM
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
## 2022-01-14 LAB — SURGICAL PATHOLOGY

## 2022-09-23 ENCOUNTER — Other Ambulatory Visit: Payer: Self-pay | Admitting: Orthopedic Surgery

## 2022-09-23 ENCOUNTER — Encounter: Payer: Self-pay | Admitting: Orthopedic Surgery

## 2022-09-23 DIAGNOSIS — Z96649 Presence of unspecified artificial hip joint: Secondary | ICD-10-CM

## 2022-09-23 DIAGNOSIS — M25551 Pain in right hip: Secondary | ICD-10-CM

## 2022-09-30 ENCOUNTER — Encounter
Admission: RE | Admit: 2022-09-30 | Discharge: 2022-09-30 | Disposition: A | Discharge status: Home / Self Care | Payer: 59 | Source: Ambulatory Visit | Attending: Orthopedic Surgery | Admitting: Orthopedic Surgery

## 2022-09-30 DIAGNOSIS — T8484XA Pain due to internal orthopedic prosthetic devices, implants and grafts, initial encounter: Secondary | ICD-10-CM | POA: Insufficient documentation

## 2022-09-30 DIAGNOSIS — Z96649 Presence of unspecified artificial hip joint: Secondary | ICD-10-CM | POA: Diagnosis present

## 2022-09-30 DIAGNOSIS — M25551 Pain in right hip: Secondary | ICD-10-CM | POA: Diagnosis present

## 2022-09-30 MED ORDER — TECHNETIUM TC 99M MEDRONATE IV KIT
20.0000 | PACK | Freq: Once | INTRAVENOUS | Status: AC | PRN
  Administered 2022-09-30: 22.04 via INTRAVENOUS

## 2022-10-08 ENCOUNTER — Other Ambulatory Visit: Payer: 59

## 2023-03-01 ENCOUNTER — Other Ambulatory Visit (HOSPITAL_BASED_OUTPATIENT_CLINIC_OR_DEPARTMENT_OTHER): Payer: Self-pay

## 2023-03-01 DIAGNOSIS — R0681 Apnea, not elsewhere classified: Secondary | ICD-10-CM

## 2023-03-01 DIAGNOSIS — R0683 Snoring: Secondary | ICD-10-CM

## 2023-03-01 DIAGNOSIS — G4710 Hypersomnia, unspecified: Secondary | ICD-10-CM

## 2023-03-22 ENCOUNTER — Other Ambulatory Visit: Payer: Self-pay | Admitting: Student

## 2023-03-22 DIAGNOSIS — R42 Dizziness and giddiness: Secondary | ICD-10-CM

## 2023-05-02 ENCOUNTER — Inpatient Hospital Stay: Admission: RE | Admit: 2023-05-02 | Payer: 59 | Source: Ambulatory Visit

## 2023-05-02 ENCOUNTER — Ambulatory Visit
Admission: RE | Admit: 2023-05-02 | Discharge: 2023-05-02 | Disposition: A | Discharge status: Home / Self Care | Payer: 59 | Source: Ambulatory Visit | Attending: Student | Admitting: Student

## 2023-05-02 DIAGNOSIS — R42 Dizziness and giddiness: Secondary | ICD-10-CM

## 2023-05-02 MED ORDER — GADOPICLENOL 0.5 MMOL/ML IV SOLN
10.0000 mL | Freq: Once | INTRAVENOUS | Status: AC | PRN
Start: 2023-05-02 — End: 2023-05-02
  Administered 2023-05-02: 10 mL via INTRAVENOUS

## 2023-05-11 ENCOUNTER — Other Ambulatory Visit: Payer: Self-pay | Admitting: Nurse Practitioner

## 2023-05-11 DIAGNOSIS — K869 Disease of pancreas, unspecified: Secondary | ICD-10-CM

## 2023-07-05 ENCOUNTER — Ambulatory Visit
Admission: RE | Admit: 2023-07-05 | Discharge: 2023-07-05 | Disposition: A | Discharge status: Home / Self Care | Payer: 59 | Source: Ambulatory Visit | Attending: Nurse Practitioner | Admitting: Nurse Practitioner

## 2023-07-05 DIAGNOSIS — K869 Disease of pancreas, unspecified: Secondary | ICD-10-CM | POA: Insufficient documentation

## 2023-07-05 MED ORDER — IOHEXOL 300 MG/ML  SOLN
100.0000 mL | Freq: Once | INTRAMUSCULAR | Status: AC | PRN
  Administered 2023-07-05: 100 mL via INTRAVENOUS

## 2023-08-03 ENCOUNTER — Other Ambulatory Visit: Payer: Self-pay | Admitting: Neurosurgery

## 2023-08-03 DIAGNOSIS — I6509 Occlusion and stenosis of unspecified vertebral artery: Secondary | ICD-10-CM

## 2023-08-26 ENCOUNTER — Ambulatory Visit (HOSPITAL_COMMUNITY)
Admission: RE | Admit: 2023-08-26 | Discharge: 2023-08-26 | Disposition: A | Discharge status: Home / Self Care | Payer: 59 | Source: Ambulatory Visit | Attending: Neurosurgery | Admitting: Neurosurgery

## 2023-08-26 ENCOUNTER — Other Ambulatory Visit: Payer: Self-pay | Admitting: Neurosurgery

## 2023-08-26 ENCOUNTER — Other Ambulatory Visit: Payer: Self-pay

## 2023-08-26 DIAGNOSIS — I6509 Occlusion and stenosis of unspecified vertebral artery: Secondary | ICD-10-CM | POA: Insufficient documentation

## 2023-08-26 DIAGNOSIS — R42 Dizziness and giddiness: Secondary | ICD-10-CM | POA: Diagnosis not present

## 2023-08-26 HISTORY — PX: IR ANGIO VERTEBRAL SEL VERTEBRAL BILAT MOD SED: IMG5369

## 2023-08-26 LAB — BASIC METABOLIC PANEL
Anion gap: 9 (ref 5–15)
BUN: 14 mg/dL (ref 6–20)
CO2: 20 mmol/L — ABNORMAL LOW (ref 22–32)
Calcium: 8.9 mg/dL (ref 8.9–10.3)
Chloride: 110 mmol/L (ref 98–111)
Creatinine, Ser: 0.75 mg/dL (ref 0.61–1.24)
GFR, Estimated: >60 mL/min (ref >60)
Glucose, Bld: 116 mg/dL — ABNORMAL HIGH (ref 70–99)
Potassium: 3.9 mmol/L (ref 3.5–5.1)
Sodium: 139 mmol/L (ref 135–145)

## 2023-08-26 LAB — PROTIME-INR
INR: 1 (ref 0.8–1.2)
Prothrombin Time: 13.6 s (ref 11.4–15.2)

## 2023-08-26 LAB — CBC WITH DIFFERENTIAL/PLATELET
Abs Immature Granulocytes: 0.02 10*3/uL (ref 0.00–0.07)
Basophils Absolute: 0.1 10*3/uL (ref 0.0–0.1)
Basophils Relative: 1 %
Eosinophils Absolute: 0.1 10*3/uL (ref 0.0–0.5)
Eosinophils Relative: 1 %
HCT: 49.4 % (ref 39.0–52.0)
Hemoglobin: 16.8 g/dL (ref 13.0–17.0)
Immature Granulocytes: 0 %
Lymphocytes Relative: 26 %
Lymphs Abs: 2.3 10*3/uL (ref 0.7–4.0)
MCH: 28.5 pg (ref 26.0–34.0)
MCHC: 34 g/dL (ref 30.0–36.0)
MCV: 83.7 fL (ref 80.0–100.0)
Monocytes Absolute: 0.5 10*3/uL (ref 0.1–1.0)
Monocytes Relative: 6 %
Neutro Abs: 5.6 10*3/uL (ref 1.7–7.7)
Neutrophils Relative %: 66 %
Platelets: 255 10*3/uL (ref 150–400)
RBC: 5.9 MIL/uL — ABNORMAL HIGH (ref 4.22–5.81)
RDW: 12.8 % (ref 11.5–15.5)
WBC: 8.6 10*3/uL (ref 4.0–10.5)
nRBC: 0 % (ref 0.0–0.2)

## 2023-08-26 LAB — GLUCOSE, CAPILLARY
Glucose-Capillary: 112 mg/dL — ABNORMAL HIGH (ref 70–99)
Glucose-Capillary: 87 mg/dL (ref 70–99)

## 2023-08-26 LAB — APTT: aPTT: 30 s (ref 24–36)

## 2023-08-26 MED ORDER — IOHEXOL 300 MG/ML  SOLN: 100.0000 mL | Freq: Once | INTRAMUSCULAR | Status: DC | PRN

## 2023-08-26 MED ORDER — FENTANYL CITRATE (PF) 100 MCG/2ML IJ SOLN
INTRAMUSCULAR | Status: AC | PRN
  Administered 2023-08-26: 25 ug via INTRAVENOUS

## 2023-08-26 MED ORDER — MIDAZOLAM HCL 2 MG/2ML IJ SOLN
INTRAMUSCULAR | Status: AC
Start: 2023-08-26 — End: ?
  Filled 2023-08-26: qty 2

## 2023-08-26 MED ORDER — LIDOCAINE HCL 1 % IJ SOLN
20.0000 mL | Freq: Once | INTRAMUSCULAR | Status: AC
  Administered 2023-08-26: 9 mL via INTRADERMAL

## 2023-08-26 MED ORDER — CHLORHEXIDINE GLUCONATE CLOTH 2 % EX PADS: 6.0000 | MEDICATED_PAD | Freq: Once | CUTANEOUS | Status: DC

## 2023-08-26 MED ORDER — LIDOCAINE HCL 1 % IJ SOLN
INTRAMUSCULAR | Status: AC
  Filled 2023-08-26: qty 20

## 2023-08-26 MED ORDER — FENTANYL CITRATE (PF) 100 MCG/2ML IJ SOLN
INTRAMUSCULAR | Status: AC
  Filled 2023-08-26: qty 2

## 2023-08-26 MED ORDER — HEPARIN SODIUM (PORCINE) 1000 UNIT/ML IJ SOLN
INTRAMUSCULAR | Status: AC | PRN
  Administered 2023-08-26: 2000 [IU] via INTRAVENOUS

## 2023-08-26 MED ORDER — MIDAZOLAM HCL 2 MG/2ML IJ SOLN
INTRAMUSCULAR | Status: AC | PRN
  Administered 2023-08-26: 1 mg via INTRAVENOUS

## 2023-08-26 MED ORDER — CEFAZOLIN SODIUM-DEXTROSE 2-4 GM/100ML-% IV SOLN: 2.0000 g | INTRAVENOUS | Status: DC

## 2023-08-26 MED ORDER — HEPARIN SODIUM (PORCINE) 1000 UNIT/ML IJ SOLN
INTRAMUSCULAR | Status: AC
  Filled 2023-08-26: qty 2

## 2023-08-26 MED ORDER — HYDROCODONE-ACETAMINOPHEN 5-325 MG PO TABS: 1.0000 | ORAL_TABLET | ORAL | Status: DC | PRN

## 2023-08-26 MED ORDER — LIDOCAINE HCL 1 % IJ SOLN
INTRAMUSCULAR | Status: AC
Start: 2023-08-26 — End: ?
  Filled 2023-08-26: qty 20
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
## 2023-08-26 NOTE — Discharge Instructions (Signed)
 Femoral Site Care This sheet gives you information about how to care for yourself after your procedure. Your health care provider may also give you more specific instructions. If you have problems or questions, contact your health care provider. What can I expect after the procedure?  After the procedure, it is common to have: Bruising that usually fades within 1-2 weeks. Tenderness at the site. Follow these instructions at home: Wound care Follow instructions from your health care provider about how to take care of your insertion site. Make sure you: Wash your hands with soap and water before you change your bandage (dressing). If soap and water are not available, use hand sanitizer. Remove your dressing as told by your health care provider. In 24 hours Do not take baths, swim, or use a hot tub until your health care provider approves. You may shower 24-48 hours after the procedure or as told by your health care provider. Gently wash the site with plain soap and water. Pat the area dry with a clean towel. Do not rub the site. This may cause bleeding. Do not apply powder or lotion to the site. Keep the site clean and dry. Check your femoral site every day for signs of infection. Check for: Redness, swelling, or pain. Fluid or blood. Warmth. Pus or a bad smell. Activity For the first 2-3 days after your procedure, or as long as directed: Avoid climbing stairs as much as possible. Do not squat. Do not lift anything that is heavier than 10 lb (4.5 kg), or the limit that you are told, until your health care provider says that it is safe. For 5 days Rest as directed. Avoid sitting for a long time without moving. Get up to take short walks every 1-2 hours. Do not drive for 24 hours if you were given a medicine to help you relax (sedative). General instructions Take over-the-counter and prescription medicines only as told by your health care provider. Keep all follow-up visits as told by  your health care provider. This is important. Contact a health care provider if you have: A fever or chills. You have redness, swelling, or pain around your insertion site. Get help right away if: The catheter insertion area swells very fast. You pass out. You suddenly start to sweat or your skin gets clammy. The catheter insertion area is bleeding, and the bleeding does not stop when you hold steady pressure on the area. The area near or just beyond the catheter insertion site becomes pale, cool, tingly, or numb. These symptoms may represent a serious problem that is an emergency. Do not wait to see if the symptoms will go away. Get medical help right away. Call your local emergency services (911 in the [place] Do not drive yourself to the hospital. Summary After the procedure, it is common to have bruising that usually fades within 1-2 weeks. Check your femoral site every day for signs of infection. Do not lift anything that is heavier than 10 lb (4.5 kg), or the limit that you are told, until your health care provider says that it is safe. This information is not intended to replace advice given to you by your health care provider. Make sure you discuss any questions you have with your health care provider. Document Revised: 08/22/2017 Document Reviewed: 08/22/2017 Elsevier Patient Education  2020 [employer].

## 2023-08-26 NOTE — Progress Notes (Addendum)
 Patient walked to the bathroom without difficulties. Right groin level 0, clean, dry, and intact.

## 2023-08-29 ENCOUNTER — Other Ambulatory Visit: Payer: Self-pay | Admitting: Neurosurgery

## 2023-12-14 ENCOUNTER — Ambulatory Visit: Admit: 2023-12-14 | Payer: 59 | Admitting: Internal Medicine

## 2023-12-14 SURGERY — COLONOSCOPY WITH PROPOFOL
Anesthesia: General
